# Patient Record
Sex: Male | Born: 1935 | Race: White | Hispanic: No | Marital: Married | Smoking: Former smoker
Health system: Southern US, Community
[De-identification: ages and names within clinical notes are randomized; demographics above are authoritative.]

## PROBLEM LIST (undated history)

## (undated) DIAGNOSIS — C801 Malignant (primary) neoplasm, unspecified: Secondary | ICD-10-CM

## (undated) DIAGNOSIS — N400 Enlarged prostate without lower urinary tract symptoms: Secondary | ICD-10-CM

## (undated) DIAGNOSIS — F0390 Unspecified dementia without behavioral disturbance: Secondary | ICD-10-CM

## (undated) DIAGNOSIS — E785 Hyperlipidemia, unspecified: Secondary | ICD-10-CM

## (undated) DIAGNOSIS — Z86010 Personal history of colonic polyps: Secondary | ICD-10-CM

## (undated) DIAGNOSIS — K5790 Diverticulosis of intestine, part unspecified, without perforation or abscess without bleeding: Secondary | ICD-10-CM

## (undated) HISTORY — PX: NOSE SURGERY: SHX723

## (undated) HISTORY — DX: Hyperlipidemia, unspecified: E78.5

## (undated) HISTORY — DX: Malignant (primary) neoplasm, unspecified: C80.1

## (undated) HISTORY — DX: Benign prostatic hyperplasia without lower urinary tract symptoms: N40.0

## (undated) HISTORY — DX: Personal history of colonic polyps: Z86.010

## (undated) HISTORY — DX: Diverticulosis of intestine, part unspecified, without perforation or abscess without bleeding: K57.90

---

## 1976-06-19 HISTORY — PX: PARTIAL GASTRECTOMY: SHX2172

## 1998-06-19 HISTORY — PX: COLON SURGERY: SHX602

## 1999-04-13 ENCOUNTER — Encounter (INDEPENDENT_AMBULATORY_CARE_PROVIDER_SITE_OTHER): Payer: Self-pay | Admitting: Specialist

## 1999-04-13 ENCOUNTER — Other Ambulatory Visit: Admission: RE | Admit: 1999-04-13 | Discharge: 1999-04-13 | Payer: Self-pay | Admitting: Gastroenterology

## 1999-04-22 ENCOUNTER — Encounter: Payer: Self-pay | Admitting: General Surgery

## 1999-04-26 ENCOUNTER — Inpatient Hospital Stay (HOSPITAL_COMMUNITY): Admission: RE | Admit: 1999-04-26 | Discharge: 1999-05-04 | Payer: Self-pay | Admitting: General Surgery

## 1999-04-26 ENCOUNTER — Encounter (INDEPENDENT_AMBULATORY_CARE_PROVIDER_SITE_OTHER): Payer: Self-pay | Admitting: Specialist

## 2000-01-05 ENCOUNTER — Encounter (INDEPENDENT_AMBULATORY_CARE_PROVIDER_SITE_OTHER): Payer: Self-pay

## 2000-01-05 ENCOUNTER — Other Ambulatory Visit: Admission: RE | Admit: 2000-01-05 | Discharge: 2000-01-05 | Payer: Self-pay | Admitting: Gastroenterology

## 2000-06-19 HISTORY — PX: PROSTATE BIOPSY: SHX241

## 2002-06-19 HISTORY — PX: COLONOSCOPY: SHX174

## 2004-06-06 ENCOUNTER — Ambulatory Visit: Payer: Self-pay | Admitting: Internal Medicine

## 2005-06-19 DIAGNOSIS — Z860100 Personal history of colon polyps, unspecified: Secondary | ICD-10-CM

## 2005-06-19 HISTORY — PX: COLONOSCOPY W/ POLYPECTOMY: SHX1380

## 2005-06-19 HISTORY — DX: Personal history of colon polyps, unspecified: Z86.0100

## 2005-06-19 HISTORY — DX: Personal history of colonic polyps: Z86.010

## 2005-09-14 ENCOUNTER — Ambulatory Visit: Payer: Self-pay | Admitting: Internal Medicine

## 2005-10-12 ENCOUNTER — Ambulatory Visit: Payer: Self-pay | Admitting: Internal Medicine

## 2005-11-06 ENCOUNTER — Ambulatory Visit: Payer: Self-pay | Admitting: Internal Medicine

## 2006-01-04 ENCOUNTER — Ambulatory Visit: Payer: Self-pay | Admitting: Gastroenterology

## 2006-01-15 ENCOUNTER — Encounter (INDEPENDENT_AMBULATORY_CARE_PROVIDER_SITE_OTHER): Payer: Self-pay | Admitting: *Deleted

## 2006-01-15 ENCOUNTER — Ambulatory Visit: Payer: Self-pay | Admitting: Gastroenterology

## 2006-02-14 ENCOUNTER — Encounter: Payer: Self-pay | Admitting: Orthopedic Surgery

## 2006-02-26 ENCOUNTER — Encounter: Payer: Self-pay | Admitting: Orthopedic Surgery

## 2006-04-12 ENCOUNTER — Ambulatory Visit: Payer: Self-pay | Admitting: Internal Medicine

## 2006-04-12 LAB — CONVERTED CEMR LAB
ALT: 21 units/L (ref 0–40)
AST: 22 units/L (ref 0–37)
Chol/HDL Ratio, serum: 4.5
Cholesterol: 169 mg/dL (ref 0–200)
HDL: 37.6 mg/dL — ABNORMAL LOW (ref >39.0)
Hgb A1c MFr Bld: 5.5 % (ref 4.6–6.0)
LDL Cholesterol: 114 mg/dL — ABNORMAL HIGH (ref 0–99)
Triglyceride fasting, serum: 89 mg/dL (ref 0–149)
VLDL: 18 mg/dL (ref 0–40)

## 2006-06-21 ENCOUNTER — Ambulatory Visit: Payer: Self-pay | Admitting: Internal Medicine

## 2006-08-14 ENCOUNTER — Ambulatory Visit: Payer: Self-pay | Admitting: Internal Medicine

## 2006-10-08 DIAGNOSIS — Z860100 Personal history of colon polyps, unspecified: Secondary | ICD-10-CM | POA: Insufficient documentation

## 2006-10-08 DIAGNOSIS — Z85038 Personal history of other malignant neoplasm of large intestine: Secondary | ICD-10-CM | POA: Insufficient documentation

## 2006-12-05 ENCOUNTER — Ambulatory Visit: Payer: Self-pay | Admitting: Internal Medicine

## 2006-12-05 DIAGNOSIS — R7989 Other specified abnormal findings of blood chemistry: Secondary | ICD-10-CM | POA: Insufficient documentation

## 2006-12-05 DIAGNOSIS — E785 Hyperlipidemia, unspecified: Secondary | ICD-10-CM

## 2007-01-14 ENCOUNTER — Ambulatory Visit: Payer: Self-pay | Admitting: Gastroenterology

## 2007-01-30 ENCOUNTER — Ambulatory Visit: Payer: Self-pay | Admitting: Gastroenterology

## 2007-01-30 ENCOUNTER — Encounter: Payer: Self-pay | Admitting: Internal Medicine

## 2007-01-30 ENCOUNTER — Encounter: Payer: Self-pay | Admitting: Gastroenterology

## 2007-04-12 ENCOUNTER — Ambulatory Visit: Payer: Self-pay | Admitting: Internal Medicine

## 2007-04-23 ENCOUNTER — Ambulatory Visit: Payer: Self-pay | Admitting: Internal Medicine

## 2007-12-11 ENCOUNTER — Telehealth: Payer: Self-pay | Admitting: Gastroenterology

## 2008-03-11 ENCOUNTER — Ambulatory Visit: Payer: Self-pay | Admitting: Internal Medicine

## 2008-03-11 DIAGNOSIS — K5730 Diverticulosis of large intestine without perforation or abscess without bleeding: Secondary | ICD-10-CM | POA: Insufficient documentation

## 2008-03-11 DIAGNOSIS — L851 Acquired keratosis [keratoderma] palmaris et plantaris: Secondary | ICD-10-CM | POA: Insufficient documentation

## 2008-03-11 DIAGNOSIS — D400 Neoplasm of uncertain behavior of prostate: Secondary | ICD-10-CM

## 2008-03-12 ENCOUNTER — Encounter (INDEPENDENT_AMBULATORY_CARE_PROVIDER_SITE_OTHER): Payer: Self-pay | Admitting: *Deleted

## 2008-03-16 ENCOUNTER — Encounter (INDEPENDENT_AMBULATORY_CARE_PROVIDER_SITE_OTHER): Payer: Self-pay | Admitting: *Deleted

## 2008-03-24 ENCOUNTER — Encounter: Payer: Self-pay | Admitting: Internal Medicine

## 2008-03-31 ENCOUNTER — Ambulatory Visit: Payer: Self-pay | Admitting: Internal Medicine

## 2008-04-13 ENCOUNTER — Telehealth (INDEPENDENT_AMBULATORY_CARE_PROVIDER_SITE_OTHER): Payer: Self-pay | Admitting: *Deleted

## 2008-05-20 ENCOUNTER — Ambulatory Visit: Payer: Self-pay | Admitting: Internal Medicine

## 2008-05-20 DIAGNOSIS — N400 Enlarged prostate without lower urinary tract symptoms: Secondary | ICD-10-CM | POA: Insufficient documentation

## 2008-05-21 ENCOUNTER — Ambulatory Visit: Payer: Self-pay | Admitting: Internal Medicine

## 2008-05-22 ENCOUNTER — Encounter (INDEPENDENT_AMBULATORY_CARE_PROVIDER_SITE_OTHER): Payer: Self-pay | Admitting: *Deleted

## 2008-05-30 LAB — CONVERTED CEMR LAB
ALT: 24 units/L (ref 0–53)
AST: 29 units/L (ref 0–37)
Albumin: 3.9 g/dL (ref 3.5–5.2)
Alkaline Phosphatase: 43 units/L (ref 39–117)
Basophils Absolute: 0 10*3/uL (ref 0.0–0.1)
Basophils Relative: 0.6 % (ref 0.0–3.0)
Bilirubin, Direct: 0.1 mg/dL (ref 0.0–0.3)
Eosinophils Absolute: 0.5 10*3/uL (ref 0.0–0.7)
Eosinophils Relative: 8.4 % — ABNORMAL HIGH (ref 0.0–5.0)
Free T4: 1 ng/dL (ref 0.6–1.6)
HCT: 42.2 % (ref 39.0–52.0)
Hemoglobin: 14.3 g/dL (ref 13.0–17.0)
Lymphocytes Relative: 23.8 % (ref 12.0–46.0)
MCHC: 34 g/dL (ref 30.0–36.0)
MCV: 87.9 fL (ref 78.0–100.0)
Monocytes Absolute: 0.3 10*3/uL (ref 0.1–1.0)
Monocytes Relative: 5.4 % (ref 3.0–12.0)
Neutro Abs: 3.8 10*3/uL (ref 1.4–7.7)
Neutrophils Relative %: 61.8 % (ref 43.0–77.0)
Platelets: 193 10*3/uL (ref 150–400)
RBC: 4.8 M/uL (ref 4.22–5.81)
RDW: 12.4 % (ref 11.5–14.6)
TSH: 1.6 microintl units/mL (ref 0.35–5.50)
Total Bilirubin: 0.6 mg/dL (ref 0.3–1.2)
Total Protein: 6.6 g/dL (ref 6.0–8.3)
WBC: 6 10*3/uL (ref 4.5–10.5)

## 2008-06-01 ENCOUNTER — Encounter (INDEPENDENT_AMBULATORY_CARE_PROVIDER_SITE_OTHER): Payer: Self-pay | Admitting: *Deleted

## 2008-06-03 ENCOUNTER — Ambulatory Visit: Payer: Self-pay | Admitting: Internal Medicine

## 2008-06-03 LAB — CONVERTED CEMR LAB
OCCULT 1: NEGATIVE
OCCULT 2: NEGATIVE
OCCULT 3: NEGATIVE

## 2008-06-04 ENCOUNTER — Encounter (INDEPENDENT_AMBULATORY_CARE_PROVIDER_SITE_OTHER): Payer: Self-pay | Admitting: *Deleted

## 2008-06-10 ENCOUNTER — Ambulatory Visit: Payer: Self-pay | Admitting: Internal Medicine

## 2008-08-03 ENCOUNTER — Ambulatory Visit: Payer: Self-pay | Admitting: Internal Medicine

## 2008-09-14 ENCOUNTER — Telehealth (INDEPENDENT_AMBULATORY_CARE_PROVIDER_SITE_OTHER): Payer: Self-pay | Admitting: *Deleted

## 2009-03-12 ENCOUNTER — Ambulatory Visit: Payer: Self-pay | Admitting: Internal Medicine

## 2009-03-12 DIAGNOSIS — Z85828 Personal history of other malignant neoplasm of skin: Secondary | ICD-10-CM

## 2009-03-15 ENCOUNTER — Encounter (INDEPENDENT_AMBULATORY_CARE_PROVIDER_SITE_OTHER): Payer: Self-pay | Admitting: *Deleted

## 2009-03-16 ENCOUNTER — Encounter (INDEPENDENT_AMBULATORY_CARE_PROVIDER_SITE_OTHER): Payer: Self-pay | Admitting: *Deleted

## 2009-03-18 ENCOUNTER — Ambulatory Visit: Payer: Self-pay | Admitting: Internal Medicine

## 2009-03-18 DIAGNOSIS — R972 Elevated prostate specific antigen [PSA]: Secondary | ICD-10-CM

## 2009-03-18 DIAGNOSIS — E162 Hypoglycemia, unspecified: Secondary | ICD-10-CM

## 2009-03-30 ENCOUNTER — Encounter: Payer: Self-pay | Admitting: Internal Medicine

## 2009-05-31 ENCOUNTER — Encounter: Payer: Self-pay | Admitting: Internal Medicine

## 2009-07-05 ENCOUNTER — Encounter: Payer: Self-pay | Admitting: Internal Medicine

## 2009-08-11 ENCOUNTER — Ambulatory Visit: Payer: Self-pay | Admitting: Internal Medicine

## 2009-08-11 DIAGNOSIS — R413 Other amnesia: Secondary | ICD-10-CM

## 2009-08-11 LAB — CONVERTED CEMR LAB

## 2009-08-23 LAB — CONVERTED CEMR LAB
Folate: >20 ng/mL
TSH: 1.85 microintl units/mL (ref 0.35–5.50)

## 2009-11-22 ENCOUNTER — Ambulatory Visit: Payer: Self-pay | Admitting: Internal Medicine

## 2009-12-14 ENCOUNTER — Encounter (INDEPENDENT_AMBULATORY_CARE_PROVIDER_SITE_OTHER): Payer: Self-pay | Admitting: *Deleted

## 2010-01-06 ENCOUNTER — Telehealth (INDEPENDENT_AMBULATORY_CARE_PROVIDER_SITE_OTHER): Payer: Self-pay | Admitting: *Deleted

## 2010-01-07 ENCOUNTER — Encounter (INDEPENDENT_AMBULATORY_CARE_PROVIDER_SITE_OTHER): Payer: Self-pay

## 2010-01-11 ENCOUNTER — Ambulatory Visit: Payer: Self-pay | Admitting: Gastroenterology

## 2010-01-14 ENCOUNTER — Encounter: Payer: Self-pay | Admitting: Internal Medicine

## 2010-01-17 ENCOUNTER — Telehealth: Payer: Self-pay | Admitting: Gastroenterology

## 2010-01-25 ENCOUNTER — Ambulatory Visit: Payer: Self-pay | Admitting: Gastroenterology

## 2010-01-28 ENCOUNTER — Encounter: Payer: Self-pay | Admitting: Gastroenterology

## 2010-02-16 ENCOUNTER — Ambulatory Visit: Payer: Self-pay | Admitting: Internal Medicine

## 2010-02-16 LAB — CONVERTED CEMR LAB: Rapid Strep: NEGATIVE

## 2010-02-17 ENCOUNTER — Telehealth: Payer: Self-pay | Admitting: Internal Medicine

## 2010-03-10 ENCOUNTER — Ambulatory Visit: Payer: Self-pay | Admitting: Internal Medicine

## 2010-04-13 ENCOUNTER — Ambulatory Visit: Payer: Self-pay | Admitting: Internal Medicine

## 2010-04-13 ENCOUNTER — Encounter: Payer: Self-pay | Admitting: Internal Medicine

## 2010-04-13 DIAGNOSIS — K219 Gastro-esophageal reflux disease without esophagitis: Secondary | ICD-10-CM

## 2010-04-18 LAB — CONVERTED CEMR LAB
ALT: 29 units/L (ref 0–53)
Albumin: 4.6 g/dL (ref 3.5–5.2)
BUN: 11 mg/dL (ref 6–23)
Basophils Absolute: 0.1 10*3/uL (ref 0.0–0.1)
CO2: 29 meq/L (ref 19–32)
Chloride: 100 meq/L (ref 96–112)
Cholesterol: 197 mg/dL (ref 0–200)
Glucose, Bld: 84 mg/dL (ref 70–99)
HCT: 42.8 % (ref 39.0–52.0)
Lymphs Abs: 1.6 10*3/uL (ref 0.7–4.0)
MCV: 88.1 fL (ref 78.0–100.0)
Monocytes Absolute: 0.4 10*3/uL (ref 0.1–1.0)
Neutro Abs: 3.9 10*3/uL (ref 1.4–7.7)
Platelets: 210 10*3/uL (ref 150.0–400.0)
Potassium: 4.4 meq/L (ref 3.5–5.1)
RDW: 13.3 % (ref 11.5–14.6)
TSH: 1.55 microintl units/mL (ref 0.35–5.50)
Total Bilirubin: 0.6 mg/dL (ref 0.3–1.2)
VLDL: 37.4 mg/dL (ref 0.0–40.0)

## 2010-04-28 ENCOUNTER — Encounter: Payer: Self-pay | Admitting: Internal Medicine

## 2010-07-17 LAB — CONVERTED CEMR LAB
ALT: 22 units/L (ref 0–53)
ALT: 25 units/L (ref 0–40)
ALT: 30 units/L (ref 0–53)
AST: 24 units/L (ref 0–37)
AST: 29 units/L (ref 0–37)
AST: 39 units/L — ABNORMAL HIGH (ref 0–37)
Albumin: 4.3 g/dL (ref 3.5–5.2)
Alkaline Phosphatase: 57 units/L (ref 39–117)
BUN: 11 mg/dL (ref 6–23)
BUN: 13 mg/dL (ref 6–23)
BUN: 14 mg/dL (ref 6–23)
Basophils Absolute: 0 10*3/uL (ref 0.0–0.1)
Basophils Relative: 0.7 % (ref 0.0–3.0)
Bilirubin, Direct: 0.1 mg/dL (ref 0.0–0.3)
CO2: 32 meq/L (ref 19–32)
Calcium: 9.3 mg/dL (ref 8.4–10.5)
Chloride: 110 meq/L (ref 96–112)
Cholesterol, target level: 200 mg/dL
Cholesterol: 158 mg/dL (ref 0–200)
Cholesterol: 162 mg/dL (ref 0–200)
Creatinine, Ser: 0.8 mg/dL (ref 0.4–1.5)
Creatinine, Ser: 0.9 mg/dL (ref 0.4–1.5)
Eosinophils Absolute: 0.4 10*3/uL (ref 0.0–0.7)
Eosinophils Relative: 8.5 % — ABNORMAL HIGH (ref 0.0–5.0)
Eosinophils Relative: 8.7 % — ABNORMAL HIGH (ref 0.0–5.0)
GFR calc Af Amer: 123 mL/min
GFR calc non Af Amer: 101 mL/min
GFR calc non Af Amer: 78.05 mL/min (ref >60)
Glucose, Bld: 98 mg/dL (ref 70–99)
HCT: 41.9 % (ref 39.0–52.0)
HCT: 42.7 % (ref 39.0–52.0)
HDL goal, serum: 40 mg/dL
HDL: 36.1 mg/dL — ABNORMAL LOW (ref >39.0)
HDL: 43.6 mg/dL (ref >39.0)
Hemoglobin: 14.1 g/dL (ref 13.0–17.0)
Hemoglobin: 14.5 g/dL (ref 13.0–17.0)
Hgb A1c MFr Bld: 5.7 % (ref 4.6–6.0)
Hgb A1c MFr Bld: 5.8 % (ref 4.6–6.0)
LDL Cholesterol: 102 mg/dL — ABNORMAL HIGH (ref 0–99)
LDL Cholesterol: 91 mg/dL (ref 0–99)
LDL Cholesterol: 92 mg/dL (ref 0–99)
LDL Goal: 130 mg/dL
Lymphocytes Relative: 28.2 % (ref 12.0–46.0)
Lymphs Abs: 1.3 10*3/uL (ref 0.7–4.0)
MCHC: 33.6 g/dL (ref 30.0–36.0)
MCV: 87.3 fL (ref 78.0–100.0)
Monocytes Absolute: 0.3 10*3/uL (ref 0.1–1.0)
Monocytes Relative: 6.7 % (ref 3.0–12.0)
Monocytes Relative: 6.7 % (ref 3.0–12.0)
Neutro Abs: 2.7 10*3/uL (ref 1.4–7.7)
Neutrophils Relative %: 55.9 % (ref 43.0–77.0)
PSA: 3.32 ng/mL (ref 0.10–4.00)
Platelets: 201 10*3/uL (ref 150–400)
Platelets: 215 10*3/uL (ref 150.0–400.0)
Potassium: 4.2 meq/L (ref 3.5–5.1)
Potassium: 4.3 meq/L (ref 3.5–5.1)
Potassium: 4.5 meq/L (ref 3.5–5.1)
RBC: 4.81 M/uL (ref 4.22–5.81)
RDW: 12.1 % (ref 11.5–14.6)
Sodium: 141 meq/L (ref 135–145)
Sodium: 144 meq/L (ref 135–145)
TSH: 1.6 microintl units/mL (ref 0.35–5.50)
TSH: 1.81 microintl units/mL (ref 0.35–5.50)
Total Bilirubin: 0.9 mg/dL (ref 0.3–1.2)
Total Bilirubin: 1.1 mg/dL (ref 0.3–1.2)
Total CHOL/HDL Ratio: 3.6
Total CHOL/HDL Ratio: 4
Total CHOL/HDL Ratio: 4.5
Total Protein: 6.9 g/dL (ref 6.0–8.3)
Triglycerides: 114 mg/dL (ref 0–149)
Triglycerides: 120 mg/dL (ref 0–149)
VLDL: 17.8 mg/dL (ref 0.0–40.0)
VLDL: 23 mg/dL (ref 0–40)
VLDL: 24 mg/dL (ref 0–40)
WBC: 4.8 10*3/uL (ref 4.5–10.5)
WBC: 4.8 10*3/uL (ref 4.5–10.5)

## 2010-07-19 NOTE — Letter (Signed)
Summary: Patient Notice- Polyp Results  [facility]  [address] [place], [place] [address]   Phone: [phone]  Fax: [phone]        January 28, 2010 MRN: [id number]    [facility] [address] [place], [place]  [address]    Dear Mr. Sherfield,  I am pleased to inform you that the colon polyp(s) removed during your recent colonoscopy was (were) found to be benign (no cancer detected) upon pathologic examination.  I recommend you have a repeat colonoscopy examination in 3_ years to look for recurrent polyps, as having colon polyps increases your risk for having recurrent polyps or even colon cancer in the future.  Should you develop new or worsening symptoms of abdominal pain, bowel habit changes or bleeding from the rectum or bowels, please schedule an evaluation with either your primary care physician or with me.  Additional information/recommendations:  _X_ No further action with gastroenterology is needed at this time. Please      follow-up with your primary care physician for your other healthcare      needs.  __ Please call [phone] to schedule a return visit to review your      situation.  __ Please keep your follow-up visit as already scheduled.  __ Continue treatment plan as outlined the day of your exam.  Please call us if you are having persistent problems or have questions about your condition that have not been fully answered at this time.  Sincerely,  Mardella Layman MD [facility]  This letter has been electronically signed by your physician.  Appended Document: Patient Notice- Polyp Results letter mailed

## 2010-07-19 NOTE — Letter (Signed)
Summary: [facility]  [facility]   Imported By: Lanelle Bal 07/15/2009 16:11:18  _____________________________________________________________________  External Attachment:    Type:   Image     Comment:   External Document

## 2010-07-19 NOTE — Miscellaneous (Signed)
Summary: Lec previsit  Clinical Lists Changes  Medications: Added new medication of MOVIPREP 100 GM  SOLR (PEG-KCL-NACL-NASULF-NA ASC-C) As per prep instructions. - Signed Rx of MOVIPREP 100 GM  SOLR (PEG-KCL-NACL-NASULF-NA ASC-C) As per prep instructions.;  #1 x 0;  Signed;  Entered by: Ulis Rias RN;  Authorized by: Mardella Layman MD [facility];  Method used: Electronically [facility]  [address] [id number] [address], [place], [place]  [address], Ph: [phone] or [phone], Fax: [phone] Observations: Added new observation of NKA: T (01/11/2010 8:50)    Prescriptions: MOVIPREP 100 GM  SOLR (PEG-KCL-NACL-NASULF-NA ASC-C) As per prep instructions.  #1 x 0   Entered by:   Ulis Rias RN   Authorized by:   Mardella Layman MD [facility]   Signed by:   Ulis Rias RN on 01/11/2010   Method used:   [facility] to        [facility]  [address] [id number]       [address]       [place], [place]  [address]       Ph: [phone] or [phone]       Fax: [phone]   RxID:   [id number]

## 2010-07-19 NOTE — Assessment & Plan Note (Signed)
Summary: not feeling well//lch   Vital Signs:  Patient profile:   76 year old male Weight:      194 pounds Temp:     97.3 degrees F oral Pulse rate:   56 / minute BP sitting:   140 / 78  (right arm)  Vitals Entered By: Jeremy Johann CMA (August 11, 2009 12:22 PM) CC: trouble remember xmonth Comments REVIEWED MED LIST, PATIENT AGREED DOSE AND INSTRUCTION CORRECT    CC:  trouble remember xmonth.  History of Present Illness: Memory loss ? 6 months, mainly not staying task. He has no issues with ADL or driving.His mother had a brain tumor with seizures. No dementia in FH. PMH of increased alcohol in [employer].  Allergies (verified): No Known Drug Allergies  Review of Systems General:  Denies chills, fever, sweats, and weight loss. Eyes:  Denies blurring, double vision, and vision loss-both eyes. CV:  Denies chest pain or discomfort and palpitations. GU:  Denies incontinence. Derm:  Denies lesion(s) and rash. Neuro:  Denies brief paralysis, difficulty with concentration, disturbances in coordination, falling down, headaches, inability to speak, numbness, poor balance, tingling, tremors, visual disturbances, and weakness. Psych:  Complains of irritability; denies anxiety, depression, easily angered, and easily tearful.  Physical Exam  General:  well-nourished,in no acute distress; alert,appropriate and cooperative throughout examination Eyes:  No corneal or conjunctival inflammation noted. EOMI. Perrla. Field of  Vision grossly normal. No lid lag Mouth:  Oral mucosa and oropharynx without lesions or exudates.  Tongue w/o deviation Heart:  Normal rate and regular rhythm. S1 and S2 normal without gallop, murmur, click, rub. S4 Pulses:  R and L carotid  pulses are full and equal bilaterally w/o bruits Extremities:  No clubbing, cyanosis, edema. Neurologic:  alert & oriented X3, cranial nerves II-XII intact, strength normal in all extremities, sensation intact to light touch, gait  normal, DTRs symmetrical and normal, finger-to-nose normal, and Romberg negative.  Minimal R hand tremor with finger to nose Skin:  Solar changes Cervical Nodes:  No lymphadenopathy noted Axillary Nodes:  No palpable lymphadenopathy Psych:  normally interactive, good eye contact, not anxious appearing, and not depressed appearing.26 on 30 scale on MMSE   Impression & Recommendations:  Problem # 1:  MEMORY LOSS (ICD-780.93) Score 26 of 30 MMSE Orders: Venipuncture (09811) TLB-B12 + Folate Pnl (91478_29562-Z30/QMV) TLB-TSH (Thyroid Stimulating Hormone) (84443-TSH) T-RPR (Syphilis) (78469-62952)  Complete Medication List: 1)  Daily Multiple Vitamins Tabs (Multiple vitamin) 2)  Vitamin E 400 Unit Caps (Vitamin e) .... Once daily 3)  Saw Palmetto Caps (Saw palmetto (serenoa repens) caps) 4)  Basa  .... 2 tab once daily 5)  Pravastatin Sodium 40 Mg Tabs (Pravastatin sodium) .... 1/2 tab qhs 6)  Fish Oil Oil (Fish oil) 7)  [place]ag  8)  Calcium 600/vitamin D 600-400 Mg-unit Tabs (Calcium carbonate-vitamin d) .Marland Kitchen.. 1 by mouth once daily 9)  Zantac 150 Mg Tabs (Ranitidine hcl) .Marland Kitchen.. 1 by mouth once daily as needed 10)  Coq10 100 Mg Caps (Coenzyme q10) .Marland Kitchen.. 1 by mouth once daily 11)  Tylenol 325 Mg Tabs (Acetaminophen) .... As needed 12)  L-methylfolate Calcium 7.5 Mg Tabs (L-methylfolate) .Marland Kitchen.. 1 once daily  Patient Instructions: 1)  Mental exercises as discussed. Trial of Deplin once daily  Prescriptions: L-METHYLFOLATE CALCIUM 7.5 MG TABS (L-METHYLFOLATE) 1 once daily  #30 x 5   Entered and Authorized by:   Marga Melnick MD   Signed by:   Marga Melnick MD on 08/11/2009   Method used:  Print then Give to Patient   RxID:   [id number]

## 2010-07-19 NOTE — Assessment & Plan Note (Signed)
Summary: 3 month roa//lch   Vital Signs:  Patient profile:   76 year old male Weight:      189.6 pounds Pulse rate:   72 / minute Resp:     16 per minute BP sitting:   120 / 76  (left arm) Cuff size:   large  Vitals Entered By: Shonna Chock (November 22, 2009 10:11 AM) CC: 3 Month follow-up, discuss labs (copy given) Comments REVIEWED MED LIST, PATIENT AGREED DOSE AND INSTRUCTION CORRECT    CC:  3 Month follow-up and discuss labs (copy given).  History of Present Illness: The memory loss has resolved; he feels symptoms were rel;ated to his wife's health issues & pressure it placed on him. He  has started doing crossword puzzles ; he denies depression.Labs reviewed ; all were normal.  Allergies (verified): No Known Drug Allergies  Review of Systems GU:  Denies incontinence. Neuro:  Denies brief paralysis, disturbances in coordination, falling down, numbness, poor balance, tingling, and weakness. Psych:  Denies anxiety, depression, easily angered, easily tearful, and irritability.  Physical Exam  General:  well-nourished,in no acute distress; alert,appropriate and cooperative throughout examination Heart:  Normal rate and regular rhythm. S1 and S2 normal without gallop, murmur, click, rub . S4 Pulses:  R and L carotid pulses are full and equal bilaterally w/o bruits Neurologic:  alert & oriented X3, strength normal in all extremities, gait normal, and DTRs symmetrical and normal.  MMSE : 27; he missed the 3 recall items Psych:  memory intact for recent and remote, normally interactive, good eye contact, not anxious appearing, and not depressed appearing.     Impression & Recommendations:  Problem # 1:  MEMORY LOSS (ICD-780.93) not documentd on repeat MMSE; major life  stressors   Complete Medication List: 1)  Daily Multiple Vitamins Tabs (Multiple vitamin) 2)  Vitamin E 400 Unit Caps (Vitamin e) .... Once daily 3)  Saw Palmetto Caps (Saw palmetto (serenoa repens) caps) 4)   Basa  .... 2 tab once daily 5)  Pravastatin Sodium 40 Mg Tabs (Pravastatin sodium) .... 1/2 tab qhs 6)  Fish Oil Oil (Fish oil) 7)  [place]  8)  Calcium 600/vitamin D 600-400 Mg-unit Tabs (Calcium carbonate-vitamin d) .Marland Kitchen.. 1 by mouth once daily 9)  Zantac 150 Mg Tabs (Ranitidine hcl) .Marland Kitchen.. 1 by mouth once daily as needed 10)  Coq10 100 Mg Caps (Coenzyme q10) .Marland Kitchen.. 1 by mouth once daily 11)  Tylenol 325 Mg Tabs (Acetaminophen) .... As needed 12)  L-methylfolate Calcium 7.5 Mg Tabs (L-methylfolate) .Marland Kitchen.. 1 once daily  Patient Instructions: 1)  Continue  "mind stimulating  exercises" as we discussed. Call if stress is excessive.

## 2010-07-19 NOTE — Assessment & Plan Note (Signed)
Summary: CPX/FASTING/KN   Vital Signs:  Patient profile:   76 year old male Height:      68.5 inches Weight:      193.8 pounds BMI:     29.14 Temp:     97.7 degrees F oral Pulse rate:   64 / minute Resp:     14 per minute BP sitting:   118 / 70  (left arm) Cuff size:   large  Vitals Entered By: Shonna Chock CMA (April 13, 2010 11:02 AM)  CC:  Heartburn.  History of Present Illness: Here for Medicare AWV: 1.Risk factors based on Past M, S, F history:GERD; Dyslipidemia; 2.Physical Activities: walking daily 1 mile  3.Depression/mood:he is under stress due his wife's illnesses 4.Hearing: aids for both ears; only L worn(evaluated annually) 5.ADL's:  no limitations 6.Fall Risk: denied ; no balance issues 7.Home Safety: safety proofed due to wife's condition (end stage DJD) 8.Height, weight, &visual acuity:wall chart read @ 6 ft with lenses 9.Counseling:none requested ; POA & Living Will  not in place  10.Labs ordered based on risk factors: see Orders 11. Referral Coordination: none requested 12. Care Plan: see Instructions 13.Cognitive Assessment: Oriented X 3; memory & recall intact  on second try  ; "WORLD" spelled backwards ; mood & affect  normal.      This is a 76 year old man who  has PMH of GERD.  The patient denies acid reflux, sour taste in mouth, epigastric pain, chest pain, trouble swallowing, weight loss, and weight gain.  The patient denies the following alarm features: melena, dysphagia, hematemesis, and vomiting.  Prior evaluation has included EGD.  The patient has found the following treatments to be effective in the past : an H2 blocker.  He is not taking this @ present excepet  as needed . Hyperlipidemia Follow-Up      The patient also presents for Hyperlipidemia follow-up.  The patient denies muscle aches, GI upset, flushing, itching, constipation, diarrhea, and fatigue.  The patient denies the following symptoms: exercise intolerance, dypsnea, palpitations,  syncope, and pedal edema.  Compliance with medications (by patient report) has been near 100%.  Dietary compliance has been good.  Adjunctive measures currently used by the patient include ASA and fish oil supplements.    Lipid Management History:      Positive NCEP/ATP III risk factors include male age 50 years old or older, HDL cholesterol less than 40, and family history for ischemic heart disease (males less than 46 years old).  Negative NCEP/ATP III risk factors include non-diabetic, non-tobacco-user status, non-hypertensive, no ASHD (atherosclerotic heart disease), no prior stroke/TIA, no peripheral vascular disease, and no history of aortic aneurysm.     Preventive Screening-Counseling & Management  Alcohol-Tobacco     Alcohol drinks/day: 0     Smoking Status: quit > 6 months     Year Quit: 1982  Caffeine-Diet-Exercise     Caffeine use/day: rarely , only decaf  Hep-HIV-STD-Contraception     Dental Visit-last 6 months yes     Sun Exposure-Excessive: no  Safety-Violence-Falls     Seat Belt Use: yes      Blood Transfusions:  prior to 1987 and Post DUD with bleed.        Travel History:  last foreign travel  2004([place]).    Current Medications (verified): 1)  Daily Multiple Vitamins   Tabs (Multiple Vitamin) 2)  Vitamin E 400 Unit  Caps (Vitamin E) .... Once Daily 3)  Saw Palmetto   Caps [employer] (  Serenoa Repens) Caps) 4)  Basa .... 2 Tab Once Daily 5)  Pravastatin Sodium 40 Mg  Tabs (Pravastatin Sodium) .... 1/2 Tab Qhs 6)  Fish Oil   Oil (Fish Oil) 7)  [place]ag 8)  Calcium 600/vitamin D 600-400 Mg-Unit Tabs (Calcium Carbonate-Vitamin D) .Marland Kitchen.. 1 By Mouth Once Daily 9)  Zantac 150 Mg Tabs (Ranitidine Hcl) .Marland Kitchen.. 1 By Mouth Once Daily As Needed 10)  Coq10 100 Mg Caps (Coenzyme Q10) .Marland Kitchen.. 1 By Mouth Once Daily 11)  Tylenol 325 Mg Tabs (Acetaminophen) .... As Needed 12)  L-Methylfolate Calcium 7.5 Mg Tabs (L-Methylfolate) .Marland Kitchen.. 1 Once Daily  Allergies (verified): No Known  Drug Allergies  Past History:  Past Medical History: Colon cancer, PMH  of in 2000 Colonic polyps,PMH  of in 2007, Dr Jarold Motto Diverticulosis, colon Benign prostatic hypertrophy, Dr Aldean Ast Skin cancer, PMH  of Hyperlipidemia: Framingham Study LDL goal = < 130  Past Surgical History: Colon Cancer surgery 2000 Ulcers-bleeding, partial gastrectomy, ? 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 primary Mother:cancer  ? cns  Siblings: bro: MI < 55, d @83  with MI,pacer ; sister :cancer  ovary; bro: cancer  lung  Social History: Former Smoker: quit 1982 Alcohol use-no Retired Married Regular exercise-yes Smoking Status:  quit > 6 months Caffeine use/day:  rarely , only decaf Dental Care w/in 6 mos.:  yes Sun Exposure-Excessive:  no Seat Belt Use:  yes Blood Transfusions:  prior to 1987, Post DUD with bleed  Review of Systems       The patient complains of suspicious skin lesions.  The patient denies anorexia, fever, hoarseness, prolonged cough, hemoptysis, hematuria, unusual weight change, abnormal bleeding, enlarged lymph nodes, and angioedema.    Physical Exam  General:  well-nourished; S/P chemical facial treatment with marked erythema;alert,appropriate and cooperative throughout examination Head:  Normocephalic and atraumatic without obvious abnormalities. No apparent alopecia  Eyes:  No corneal or conjunctival inflammation noted.  Perrla. Funduscopic exam benign, without hemorrhages, exudates or papilledema.  Ears:  External ear exam shows no significant lesions or deformities.  Otoscopic examination reveals clear canals, tympanic membranes are intact bilaterally without bulging, retraction, inflammation or discharge. Hearing is grossly normal bilaterally. Nose:   External nasal examination shows no deformity or inflammation. Nasal mucosa are pink and moist without lesions or exudates.Septal deviation with occlusion Mouth:  Oral mucosa and oropharynx without lesions or exudates.  Teeth in good repair; upper plate. Neck:  No deformities, masses, or tenderness noted. Lungs:  Normal respiratory effort, chest expands symmetrically. Lungs are clear to auscultation, no crackles or wheezes. Heart:  Normal rate and regular rhythm. S1 and S2 normal without gallop, murmur, click, rub or other extra sounds. Abdomen:  Bowel sounds positive,abdomen soft and non-tender without masses, organomegaly or hernias noted. Mid line op scar Genitalia:  Dr Aldean Ast  Msk:  No deformity or scoliosis noted of thoracic or lumbar spine but asymmetry of thoracic muscles ( R > L).   Pulses:  R and L carotid,radial,dorsalis pedis and posterior tibial pulses are full and equal bilaterally Extremities:  No clubbing, cyanosis, edema. DIP OA changes RUE Neurologic:  alert & oriented X3 and DTRs symmetrical and normal.   Skin:  Splotchy facial  erythema from chemical treatments Cervical Nodes:  No lymphadenopathy noted Axillary Nodes:  No palpable lymphadenopathy Psych:  memory intact for recent and remote, normally interactive, and  good eye contact.     Impression & Recommendations:  Problem # 1:  PREVENTIVE HEALTH CARE (ICD-V70.0)  Orders: [facility] -Subsequent Annual Wellness Visit [phone])  Problem # 2:  HYPERLIPIDEMIA (ICD-272.4)  His updated medication list for this problem includes:    Pravastatin Sodium 40 Mg Tabs (Pravastatin sodium) .Marland Kitchen... 1/2 tab qhs  Orders: EKG w/ Interpretation (93000) Venipuncture (60454) TLB-Lipid Panel (80061-LIPID) TLB-BMP (Basic Metabolic Panel-BMET) (80048-METABOL) TLB-Hepatic/Liver Function Pnl (80076-HEPATIC) TLB-TSH (Thyroid Stimulating Hormone) (84443-TSH)  Problem # 3:  GERD (ICD-530.81)  controlled; PMH of DUD His updated medication list  for this problem includes:    Zantac 150 Mg Tabs (Ranitidine hcl) .Marland Kitchen... 1 by mouth once daily as needed  Orders: Venipuncture (09811) TLB-CBC Platelet - w/Differential (85025-CBCD)  Problem # 4:  ELEVATED PROSTATE SPECIFIC ANTIGEN (ICD-790.93) as per Dr Aldean Ast  Problem # 5:  COLONIC POLYPS, HX OF (ICD-V12.72) as per Dr Jarold Motto  Complete Medication List: 1)  Daily Multiple Vitamins Tabs (Multiple vitamin) 2)  Vitamin E 400 Unit Caps (Vitamin e) .... Once daily 3)  Saw Palmetto Caps (Saw palmetto (serenoa repens) caps) 4)  Basa  .... 2 tab once daily 5)  Pravastatin Sodium 40 Mg Tabs (Pravastatin sodium) .... 1/2 tab qhs 6)  Fish Oil Oil (Fish oil) 7)  [place]  8)  Calcium 600/vitamin D 600-400 Mg-unit Tabs (Calcium carbonate-vitamin d) .Marland Kitchen.. 1 by mouth once daily 9)  Zantac 150 Mg Tabs (Ranitidine hcl) .Marland Kitchen.. 1 by mouth once daily as needed 10)  Coq10 100 Mg Caps (Coenzyme q10) .Marland Kitchen.. 1 by mouth once daily 11)  Tylenol 325 Mg Tabs (Acetaminophen) .... As needed 12)  L-methylfolate Calcium 7.5 Mg Tabs (L-methylfolate) .Marland Kitchen.. 1 once daily  Lipid Assessment/Plan:      Based on NCEP/ATP III, the patient's risk factor category is "2 or more risk factors and a calculated 10 year CAD risk of < 20%".  The patient's lipid goals are as follows: Total cholesterol goal is 200; LDL cholesterol goal is 130; HDL cholesterol goal is 40; Triglyceride goal is 150.    Patient Instructions: 1)  Consider POA & Living Will completeion. 2)  It is important that you exercise regularly at least 20 minutes 5 times a week. If you develop chest pain, have severe difficulty breathing, or feel very tired , stop exercising immediately and seek medical attention 81 mg COATED  Aspirin every day with a meal. 3)  Avoid foods high in acid (tomatoes, citrus juices, spicy foods). Avoid eating within two hours of lying down or before exercising. Do not over eat; try smaller more frequent meals. Elevate head of bed twelve  inches when sleeping.   Orders Added: 1)  [facility] -Subsequent Annual Wellness Visit [G0439] 2)  Est. Patient Level III [91478] 3)  EKG w/ Interpretation [93000] 4)  Venipuncture [36415] 5)  TLB-Lipid Panel [80061-LIPID] 6)  TLB-BMP (Basic Metabolic Panel-BMET) [80048-METABOL] 7)  TLB-CBC Platelet - w/Differential [85025-CBCD] 8)  TLB-Hepatic/Liver Function Pnl [80076-HEPATIC] 9)  TLB-TSH (Thyroid Stimulating Hormone) [29562-ZHY]

## 2010-07-19 NOTE — Progress Notes (Signed)
Summary: derm recommendation  Phone Note Call from Patient Call back at Home Phone [phone]   Summary of Call: patient has lesions on his face - he goes to [facility]  -- he wants to know who dr hopper would recommend in that group Initial call taken by: Okey Regal [place],  January 06, 2010 4:46 PM  Follow-up for Phone Call        Dr.Hopper please advise Follow-up by: Shonna Chock CMA,  January 06, 2010 4:53 PM  Additional Follow-up for Phone Call Additional follow up Details #1::        [facility] [place]; any physician he sees in that group will be outstanding (he should tell the MD I said this when seen). Hopp Additional Follow-up by: Marga Melnick MD,  January 07, 2010 6:17 AM    Additional Follow-up for Phone Call Additional follow up Details #2::    I tried to called the patient, no answer. Recording stated machine is off, unable to leave a message./[facility] CMA  January 07, 2010 8:07 AM   Left message on machine with Dr.Hopper's response, patient to call if any additional questions or concerns./[facility] CMA  January 07, 2010 1:41 PM

## 2010-07-19 NOTE — Procedures (Signed)
Summary: Colonoscopy  Patient: [name] Note: All result statuses are Final unless otherwise noted.  Tests: (1) Colonoscopy (COL)   COL Colonoscopy           DONE     [facility]     [address]     [place], [place]  [address]           COLONOSCOPY PROCEDURE REPORT           PATIENT:  Manuel Evans, Manuel Evans  MR#:  [id number]     BIRTHDATE:  Dec 19, 1934, 74 yrs. old  GENDER:  male     ENDOSCOPIST:  Vania Rea. Jarold Motto, MD, [facility]     REF. BY:     PROCEDURE DATE:  01/25/2010     PROCEDURE:  Colonoscopy with snare polypectomy     ASA CLASS:  Class II     INDICATIONS:  history of pre-cancerous (adenomatous) colon polyps,     history of colon cancer     MEDICATIONS:   Fentanyl 50 mcg IV, Versed 5 mg IV           DESCRIPTION OF PROCEDURE:   After the risks benefits and     alternatives of the procedure were thoroughly explained, informed     consent was obtained.  Digital rectal exam was performed and     revealed no abnormalities.   The LB [id number] endoscope     was introduced through the anus and advanced to the anastomosis,     without limitations.  The quality of the prep was excellent, using     MoviPrep.  The instrument was then slowly withdrawn as the colon     was fully examined.     <<PROCEDUREIMAGES>>           FINDINGS:  There was a surgical anastomosis. right colon resection     and anastomosis appears normal.  A sessile polyp was found in the     mid transverse colon. see pictures.hot snare removed  Moderate     diverticulosis was found in the sigmoid to descending colon     segments.   Retroflexed views in the rectum revealed no     abnormalities.    The scope was then withdrawn from the patient     and the procedure completed.           COMPLICATIONS:  None     ENDOSCOPIC IMPRESSION:     1) Anastomosis     2) Sessile polyp in the mid transverse colon     3) Moderate diverticulosis in the sigmoid to descending colon     segments     hx  of prior cancer.needs close f/u.     RECOMMENDATIONS:     3y F/U     REPEAT EXAM:  No           ______________________________     Vania Rea. Jarold Motto, MD, Clement[facility] Graham           CC:  Pecola Lawless, MD           n.     Rosalie DoctorMarland Kitchen   Vania Rea. Lashan Gluth at 01/25/2010 09:42 AM           Malachi Pro, [phone]  Note: An exclamation mark (!) indicates a result that was not dispersed into the flowsheet. Document Creation Date: 01/25/2010 9:44 AM _______________________________________________________________________  (1) Order result status: Final Collection or observation date-time: 01/25/2010 09:33 Requested date-time:  Receipt date-time:  Reported date-time:  Referring Physician:   Ordering Physician: Sheryn Bison [id number]) Specimen Source:  Source: Launa Grill Order Number: [id number] Lab site:   Appended Document: Colonoscopy     Procedures Next Due Date:    Colonoscopy: 01/2013

## 2010-07-19 NOTE — Assessment & Plan Note (Signed)
Summary: flu shot/cbs   Nurse Visit   Allergies: No Known Drug Allergies  Orders Added: 1)  Flu Vaccine 3yrs + MEDICARE PATIENTS [Q2039] 2)  Administration Flu vaccine - MCR [G0008]  Flu Vaccine Consent Questions     Do you have a history of severe allergic reactions to this vaccine? no    Any prior history of allergic reactions to egg and/or gelatin? no    Do you have a sensitivity to the preservative Thimersol? no    Do you have a past history of Guillan-Barre Syndrome? no    Do you currently have an acute febrile illness? no    Have you ever had a severe reaction to latex? no    Vaccine information given and explained to patient? yes    Are you currently pregnant? no    Lot Number:[id number]   Exp Date:12/17/2010   Site Given  Left Deltoid IM 

## 2010-07-19 NOTE — Letter (Signed)
Summary: [facility]  [facility]   Imported By: Lanelle Bal 05/10/2010 08:30:51  _____________________________________________________________________  External Attachment:    Type:   Image     Comment:   External Document

## 2010-07-19 NOTE — Letter (Signed)
Summary: Patient Notice- Polyp Results  [facility]  [address] [place], [place] [address]   Phone: [phone]  Fax: [phone]        January 28, 2010 MRN: [id number]    [facility] [address] [place], [place]  [address]    Dear Mr. Cefalu,  I am pleased to inform you that the colon polyp(s) removed during your recent colonoscopy was (were) found to be benign (no cancer detected) upon pathologic examination.  I recommend you have a repeat colonoscopy examination in 3_ years to look for recurrent polyps, as having colon polyps increases your risk for having recurrent polyps or even colon cancer in the future.  Should you develop new or worsening symptoms of abdominal pain, bowel habit changes or bleeding from the rectum or bowels, please schedule an evaluation with either your primary care physician or with me.  Additional information/recommendations:  _X_ No further action with gastroenterology is needed at this time. Please      follow-up with your primary care physician for your other healthcare      needs.  __ Please call [phone] to schedule a return visit to review your      situation.  __ Please keep your follow-up visit as already scheduled.  __ Continue treatment plan as outlined the day of your exam.  Please call us if you are having persistent problems or have questions about your condition that have not been fully answered at this time.  Sincerely,  Mardella Layman MD [facility]  This letter has been electronically signed by your physician.

## 2010-07-19 NOTE — Letter (Signed)
Summary: [facility]  [facility]   Imported By: Lanelle Bal 01/24/2010 13:14:09  _____________________________________________________________________  External Attachment:    Type:   Image     Comment:   External Document

## 2010-07-19 NOTE — Letter (Signed)
Summary: Colonoscopy Letter  [facility]  [address] [place], [place] [address]   Phone: [phone]  Fax: [phone]      December 14, 2009 MRN: [id number]   [facility] [address] [place], [place]  [address]   Dear Mr. Labarre,   According to your medical record, it is time for you to schedule a Colonoscopy. The American Cancer Society recommends this procedure as a method to detect early colon cancer. Patients with a family history of colon cancer, or a personal history of colon polyps or inflammatory bowel disease are at increased risk.  This letter has beeen generated based on the recommendations made at the time of your procedure. If you feel that in your particular situation this may no longer apply, please contact our office.  Please call our office at [phone] to schedule this appointment or to update your records at your earliest convenience.  Thank you for cooperating with [place] to provide you with the very best care possible.   Sincerely,   Vania Rea. Jarold Motto, M.D.  [facility] [phone]

## 2010-07-19 NOTE — Assessment & Plan Note (Signed)
Summary: SORE THROAT/RH........Marland Kitchen   Vital Signs:  Patient profile:   76 year old male Weight:      190.2 pounds BMI:     28.19 Temp:     98.2 degrees F oral Pulse rate:   72 / minute Resp:     15 per minute BP sitting:   122 / 70  (left arm) Cuff size:   large  Vitals Entered By: Shonna Chock CMA (February 16, 2010 11:53 AM) CC: Sore throat, onset this morning, URI symptoms   CC:  Sore throat, onset this morning, and URI symptoms.  History of Present Illness:  URI Symptoms      This is a 76 year old man who presents with URI symptoms as ST as of this am.  The patient reports sore throat, but denies nasal congestion, purulent nasal discharge, productive cough, and earache.  The patient denies fever.  The patient also reports sneezing 08/30.  The patient denies itchy watery eyes and headache.  The patient denies the following risk factors for Strep sinusitis:  facial pain, tooth pain, and tender adenopathy.  Rx: none  Current Medications (verified): 1)  Daily Multiple Vitamins   Tabs (Multiple Vitamin) 2)  Vitamin E 400 Unit  Caps (Vitamin E) .... Once Daily 3)  Saw Palmetto   Caps (Saw Palmetto (Serenoa Repens) Caps) 4)  Basa .... 2 Tab Once Daily 5)  Pravastatin Sodium 40 Mg  Tabs (Pravastatin Sodium) .... 1/2 Tab Qhs 6)  Fish Oil   Oil (Fish Oil) 7)  [place] 8)  Calcium 600/vitamin D 600-400 Mg-Unit Tabs (Calcium Carbonate-Vitamin D) .Marland Kitchen.. 1 By Mouth Once Daily 9)  Zantac 150 Mg Tabs (Ranitidine Hcl) .Marland Kitchen.. 1 By Mouth Once Daily As Needed 10)  Coq10 100 Mg Caps (Coenzyme Q10) .Marland Kitchen.. 1 By Mouth Once Daily 11)  Tylenol 325 Mg Tabs (Acetaminophen) .... As Needed 12)  L-Methylfolate Calcium 7.5 Mg Tabs (L-Methylfolate) .Marland Kitchen.. 1 Once Daily  Allergies (verified): No Known Drug Allergies  Physical Exam  General:  in no acute distress; alert,appropriate and cooperative throughout examination Ears:  External ear exam shows no significant lesions or deformities.  Otoscopic examination  reveals clear canals, tympanic membranes are intact bilaterally without bulging, retraction, inflammation or discharge. Hearing aid on L Nose:  External nasal examination shows no deformity or inflammation. Nasal mucosa are pink and moist without lesions or exudates. Mouth:  Oral mucosa and oropharynx without lesions or exudates.Minimal pharyngeal erythema.   Lungs:  Normal respiratory effort, chest expands symmetrically. Lungs are clear to auscultation, no crackles or wheezes. Heart:  Normal rate and regular rhythm. S1 and S2 normal without gallop, murmur, click, rub.S4 with slurring Cervical Nodes:  No lymphadenopathy noted Axillary Nodes:  No palpable lymphadenopathy   Impression & Recommendations:  Problem # 1:  PHARYNGITIS-ACUTE (ICD-462)  His updated medication list for this problem includes:    Tylenol 325 Mg Tabs (Acetaminophen) .Marland Kitchen... As needed  Complete Medication List: 1)  Daily Multiple Vitamins Tabs (Multiple vitamin) 2)  Vitamin E 400 Unit Caps (Vitamin e) .... Once daily 3)  Saw Palmetto Caps (Saw palmetto (serenoa repens) caps) 4)  Basa  .... 2 tab once daily 5)  Pravastatin Sodium 40 Mg Tabs (Pravastatin sodium) .... 1/2 tab qhs 6)  Fish Oil Oil (Fish oil) 7)  [place]  8)  Calcium 600/vitamin D 600-400 Mg-unit Tabs (Calcium carbonate-vitamin d) .Marland Kitchen.. 1 by mouth once daily 9)  Zantac 150 Mg Tabs (Ranitidine hcl) .Marland Kitchen.. 1 by mouth once daily  as needed 10)  Coq10 100 Mg Caps (Coenzyme q10) .Marland Kitchen.. 1 by mouth once daily 11)  Tylenol 325 Mg Tabs (Acetaminophen) .... As needed 12)  L-methylfolate Calcium 7.5 Mg Tabs (L-methylfolate) .Marland Kitchen.. 1 once daily  Other Orders: Rapid Strep (11914)  Patient Instructions: 1)  Zicam Melts as needed for sore throat. Vitamin C 2000 mg once daily  & Echinacea as per bottle.Drink as much fluid as you can tolerate for the next few days. Neti pot once daily if sinus congestion occurs. Report pain , pus & fever as discussed.  Laboratory Results     Other Tests  Rapid Strep: negative

## 2010-07-19 NOTE — Progress Notes (Signed)
Summary: [facility]  Phone Note Call from Patient   Caller: Spouse- Vicky Summary of Call: Spouse calls and states that her and pt were up all night due to pts coughing. Pt was seen by Dr. Alwyn Ren yesterday and is not getting any better. He is coughing up yellow "stuff". Please advise. [phone] Initial call taken by: Lavell Islam,  February 17, 2010 12:57 PM  Follow-up for Phone Call        per dr hopper z-pak if not allergic, PROMETHAZINE-CODEINE. spoke with pt wife she states pt has no allergy, rx sent to pharmacy..............Marland KitchenFelecia Deloach CMA  February 17, 2010 2:15 PM     New/Updated Medications: PROMETHAZINE-CODEINE 6.25-10 MG/5ML SYRP (PROMETHAZINE-CODEINE) 1 tsp every 6 hours as needed ZITHROMAX Z-PAK 250 MG TABS (AZITHROMYCIN) as directed Prescriptions: ZITHROMAX Z-PAK 250 MG TABS (AZITHROMYCIN) as directed  #1 x 0   Entered by:   Jeremy Johann CMA   Authorized by:   Marga Melnick MD   Signed by:   Jeremy Johann CMA on 02/17/2010   Method used:   Faxed to ...       [facility]  [address] [id number])       [address]       [place], [place]  [address]       Ph: [phone] or [phone]       Fax: [phone]   RxID:   [id number] PROMETHAZINE-CODEINE 6.25-10 MG/5ML SYRP (PROMETHAZINE-CODEINE) 1 tsp every 6 hours as needed  #90cc x 0   Entered by:   Jeremy Johann CMA   Authorized by:   Marga Melnick MD   Signed by:   Jeremy Johann CMA on 02/17/2010   Method used:   Printed then faxed to ...       [facility]  [address] [id number]       [address]       [place], [place]  [address]       Ph: [phone] or [phone]       Fax: [phone]   RxID:   [id number]

## 2010-07-19 NOTE — Progress Notes (Signed)
Summary: ? re prep   Phone Note Call from Patient Call back at Home Phone [phone]   Caller: Patient Call For: Dr Jarold Motto Reason for Call: Talk to Nurse Summary of Call: Patient wants to speak to nurse regarding colon scheduled for next week. Initial call taken by: Tawni Levy,  January 17, 2010 3:55 PM  Follow-up for Phone Call        Wife just asked if this was necessary because he was "confused" about this, I stated that if he received letter from Dr.Patterson that it was time to repeat colon exam than this is important and that he should have this procedure, patient has HX of colon CA.  Wife seems pleased with answer and understands this. Follow-up by: Sherren Kerns RN,  January 17, 2010 4:20 PM

## 2010-07-19 NOTE — Letter (Signed)
Summary: [facility] Instructions  [facility]  [address] [place], [place] [address]   Phone: [phone]  Fax: [phone]       Manuel Evans    1934/10/10    MRN: [id number]        Procedure Day /Date:  01/25/10   Tuesday     Arrival Time:  8:00am      Procedure Time:  9:00am     Location of Procedure:                    _x _  [facility] (4th Floor)                        PREPARATION FOR COLONOSCOPY WITH MOVIPREP   Starting 5 days prior to your procedure _ 8/4/11_ do not eat nuts, seeds, popcorn, corn, beans, peas,  salads, or any raw vegetables.  Do not take any fiber supplements (e.g. Metamucil, Citrucel, and Benefiber).  THE DAY BEFORE YOUR PROCEDURE         DATE:   01/24/10  DAY:  Monday  1.  Drink clear liquids the entire day-NO SOLID FOOD  2.  Do not drink anything colored red or purple.  Avoid juices with pulp.  No orange juice.  3.  Drink at least 64 oz. (8 glasses) of fluid/clear liquids during the day to prevent dehydration and help the prep work efficiently.  CLEAR LIQUIDS INCLUDE: Water Jello Ice Popsicles Tea (sugar ok, no milk/cream) Powdered fruit flavored drinks Coffee (sugar ok, no milk/cream) Gatorade Juice: apple, white grape, white cranberry  Lemonade Clear bullion, consomm, broth Carbonated beverages (any kind) Strained chicken noodle soup Hard Candy                             4.  In the morning, mix first dose of MoviPrep solution:    Empty 1 Pouch A and 1 Pouch B into the disposable container    Add lukewarm drinking water to the top line of the container. Mix to dissolve    Refrigerate (mixed solution should be used within 24 hrs)  5.  Begin drinking the prep at 5:00 p.m. The MoviPrep container is divided by 4 marks.   Every 15 minutes drink the solution down to the next mark (approximately 8 oz) until the full liter is complete.   6.  Follow completed prep with 16 oz of clear liquid of your  choice (Nothing red or purple).  Continue to drink clear liquids until bedtime.  7.  Before going to bed, mix second dose of MoviPrep solution:    Empty 1 Pouch A and 1 Pouch B into the disposable container    Add lukewarm drinking water to the top line of the container. Mix to dissolve    Refrigerate  THE DAY OF YOUR PROCEDURE      DATE:  01/25/10  DAY:  Tuesday  Beginning at  4:00 a.m. (5 hours before procedure):         1. Every 15 minutes, drink the solution down to the next mark (approx 8 oz) until the full liter is complete.  2. Follow completed prep with 16 oz. of clear liquid of your choice.    3. You may drink clear liquids until 7:00am   (2 HOURS BEFORE PROCEDURE).   MEDICATION INSTRUCTIONS  Unless otherwise instructed, you should take regular prescription medications with a  small sip of water   as early as possible the morning of your procedure.         OTHER INSTRUCTIONS  You will need a responsible adult at least 74 years of age to accompany you and drive you home.   This person must remain in the waiting room during your procedure.  Wear loose fitting clothing that is easily removed.  Leave jewelry and other valuables at home.  However, you may wish to bring a book to read or  an iPod/MP3 player to listen to music as you wait for your procedure to start.  Remove all body piercing jewelry and leave at home.  Total time from sign-in until discharge is approximately 2-3 hours.  You should go home directly after your procedure and rest.  You can resume normal activities the  day after your procedure.  The day of your procedure you should not:   Drive   Make legal decisions   Operate machinery   Drink alcohol   Return to work  You will receive specific instructions about eating, activities and medications before you leave.    The above instructions have been reviewed and explained to me by   Ulis Rias RN  January 11, 2010 9:22 AM     I fully  understand and can verbalize these instructions _____________________________ Date _________

## 2010-08-05 ENCOUNTER — Ambulatory Visit: Payer: Self-pay | Admitting: Internal Medicine

## 2010-08-15 ENCOUNTER — Encounter: Payer: Self-pay | Admitting: Internal Medicine

## 2010-08-15 ENCOUNTER — Ambulatory Visit (INDEPENDENT_AMBULATORY_CARE_PROVIDER_SITE_OTHER): Payer: Medicare Other | Admitting: Internal Medicine

## 2010-08-15 DIAGNOSIS — K219 Gastro-esophageal reflux disease without esophagitis: Secondary | ICD-10-CM

## 2010-08-15 DIAGNOSIS — E785 Hyperlipidemia, unspecified: Secondary | ICD-10-CM

## 2010-08-17 ENCOUNTER — Encounter (INDEPENDENT_AMBULATORY_CARE_PROVIDER_SITE_OTHER): Payer: Self-pay | Admitting: *Deleted

## 2010-08-17 ENCOUNTER — Other Ambulatory Visit (INDEPENDENT_AMBULATORY_CARE_PROVIDER_SITE_OTHER): Payer: Medicare Other

## 2010-08-17 ENCOUNTER — Other Ambulatory Visit: Payer: Self-pay | Admitting: Internal Medicine

## 2010-08-17 DIAGNOSIS — E785 Hyperlipidemia, unspecified: Secondary | ICD-10-CM

## 2010-08-17 LAB — BASIC METABOLIC PANEL
BUN: 12 mg/dL (ref 6–23)
Chloride: 107 mEq/L (ref 96–112)
GFR: 87.79 mL/min (ref >60.00)
Potassium: 4.4 mEq/L (ref 3.5–5.1)
Sodium: 142 mEq/L (ref 135–145)

## 2010-08-17 LAB — LIPID PANEL
Cholesterol: 173 mg/dL (ref 0–200)
LDL Cholesterol: 112 mg/dL — ABNORMAL HIGH (ref 0–99)

## 2010-08-17 LAB — ALT: ALT: 20 U/L (ref 0–53)

## 2010-08-25 NOTE — Assessment & Plan Note (Signed)
Summary: 6 month followup///sph   Vital Signs:  Patient profile:   76 year old male Weight:      191.6 pounds Pulse rate:   60 / minute Resp:     13 per minute BP sitting:   110 / 74  (left arm) Cuff size:   large  Vitals Entered By: Shonna Chock CMA (August 15, 2010 10:31 AM) CC: 6 month follow-up, fasting if labs needed, Heartburn   CC:  6 month follow-up, fasting if labs needed, and Heartburn.  History of Present Illness:    GERD F/U:  Off Ranitidine Mosetta Putt  denies acid reflux, sour taste in mouth, epigastric pain, chest pain, trouble swallowing, weight loss, and weight gain.  The patient denies the following alarm features: melena, dysphagia, hematemesis, and vomiting.      Hyperlipidemia Follow-Up: He  denies muscle aches, abdominal pain, flushing, itching, constipation, diarrhea, and fatigue.  The patient denies the following symptoms: dypsnea and palpitations.  Compliance with medications (by patient report) has been near 100%.  Dietary compliance has been good.  The patient reports exercising daily.  Adjunctive measures currently used by the patient include fish oil supplements.    Current Medications (verified): 1)  Daily Multiple Vitamins   Tabs (Multiple Vitamin) 2)  Vitamin E 400 Unit  Caps (Vitamin E) .... Once Daily 3)  Saw Palmetto   Caps (Saw Palmetto (Serenoa Repens) Caps) 4)  Basa .... 2 Tab Once Daily 5)  Pravastatin Sodium 40 Mg  Tabs (Pravastatin Sodium) .... 1/2 Tab Qhs 6)  Fish Oil   Oil (Fish Oil) 7)  [place] 8)  Calcium 600/vitamin D 600-400 Mg-Unit Tabs (Calcium Carbonate-Vitamin D) .Marland Kitchen.. 1 By Mouth Once Daily 9)  Zantac 150 Mg Tabs (Ranitidine Hcl) .Marland Kitchen.. 1 By Mouth Once Daily As Needed 10)  Coq10 100 Mg Caps (Coenzyme Q10) .Marland Kitchen.. 1 By Mouth Once Daily 11)  Tylenol 325 Mg Tabs (Acetaminophen) .... As Needed 12)  L-Methylfolate Calcium 7.5 Mg Tabs (L-Methylfolate) .Marland Kitchen.. 1 Once Daily  Allergies (verified): No Known Drug Allergies  Physical Exam  General:   well-nourished; alert,appropriate and cooperative throughout examination Eyes:  No  icterus Mouth:  Oral mucosa and oropharynx without lesions or exudates.  Teeth in good repair. Mild pharyngeal erythema.   Lungs:  Normal respiratory effort, chest expands symmetrically. Lungs are clear to auscultation, no crackles or wheezes. Heart:  Normal rate and regular rhythm. S1 and S2 normal without gallop, murmur, click, rub .S4 Abdomen:  Bowel sounds positive,abdomen soft and non-tender without masses, organomegaly or hernias noted. Op scar well healed Pulses:  R and L carotid,radial,dorsalis pedis and posterior tibial pulses are full and equal bilaterally Extremities:  No clubbing, cyanosis, edema.  Neurologic:  alert & oriented X3.   Skin:  Intact without suspicious lesions or rashes. No jaundice Psych:  Focused & intelligent   Impression & Recommendations:  Problem # 1:  HYPERLIPIDEMIA (ICD-272.4)  His updated medication list for this problem includes:    Pravastatin Sodium 40 Mg Tabs (Pravastatin sodium) .Marland Kitchen... 1/2 tab qhs  Orders: Venipuncture (16109) TLB-BMP (Basic Metabolic Panel-BMET) (80048-METABOL) TLB-Lipid Panel (80061-LIPID) TLB-ALT (SGPT) (84460-ALT) TLB-AST (SGOT) (84450-SGOT)  Problem # 2:  GERD (ICD-530.81) asymptomatic off Ranitidine His updated medication list for this problem includes:    Zantac 150 Mg Tabs (Ranitidine hcl) .Marland Kitchen... 1 by mouth once daily as needed  Complete Medication List: 1)  Daily Multiple Vitamins Tabs (Multiple vitamin) 2)  Vitamin E 400 Unit Caps (Vitamin e) .... Once daily 3)  Saw Palmetto Caps (Saw palmetto (serenoa repens) caps) 4)  Basa  .... 2 tab once daily 5)  Pravastatin Sodium 40 Mg Tabs (Pravastatin sodium) .... 1/2 tab qhs 6)  Fish Oil Oil (Fish oil) 7)  [place]  8)  Calcium 600/vitamin D 600-400 Mg-unit Tabs (Calcium carbonate-vitamin d) .Marland Kitchen.. 1 by mouth once daily 9)  Zantac 150 Mg Tabs (Ranitidine hcl) .Marland Kitchen.. 1 by mouth once daily as  needed 10)  Coq10 100 Mg Caps (Coenzyme q10) .Marland Kitchen.. 1 by mouth once daily 11)  Tylenol 325 Mg Tabs (Acetaminophen) .... As needed 12)  L-methylfolate Calcium 7.5 Mg Tabs (L-methylfolate) .Marland Kitchen.. 1 once daily  Patient Instructions: 1)  Avoid foods high in acid (tomatoes, citrus juices, spicy foods). Avoid eating within two hours of lying down or before exercising. Do not over eat; try smaller more frequent meals. Elevate head of bed twelve inches when sleeping. Ranitidine every 12 hrs pre  meals as needed .   Orders Added: 1)  Est. Patient Level III [81191] 2)  Venipuncture [36415] 3)  TLB-BMP (Basic Metabolic Panel-BMET) [80048-METABOL] 4)  TLB-Lipid Panel [80061-LIPID] 5)  TLB-ALT (SGPT) [84460-ALT] 6)  TLB-AST (SGOT) [84450-SGOT]

## 2010-11-16 ENCOUNTER — Telehealth: Payer: Self-pay | Admitting: Internal Medicine

## 2010-11-16 ENCOUNTER — Encounter: Payer: Self-pay | Admitting: Internal Medicine

## 2010-11-16 ENCOUNTER — Ambulatory Visit (INDEPENDENT_AMBULATORY_CARE_PROVIDER_SITE_OTHER): Payer: Medicare Other | Admitting: Internal Medicine

## 2010-11-16 DIAGNOSIS — E785 Hyperlipidemia, unspecified: Secondary | ICD-10-CM

## 2010-11-16 DIAGNOSIS — E162 Hypoglycemia, unspecified: Secondary | ICD-10-CM

## 2010-11-16 DIAGNOSIS — R413 Other amnesia: Secondary | ICD-10-CM
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
## 2010-11-16 NOTE — Patient Instructions (Signed)
Please keep Neurology referral

## 2010-11-17 NOTE — Telephone Encounter (Signed)
There are no specific  MVI as  Treatment or prevention for Alzheimer's. Gingo Biloba Extract was not effective in study @ [address]

## 2010-11-18 NOTE — Telephone Encounter (Signed)
Spoke w/ pt wife aware of recommendations.

## 2011-01-24 ENCOUNTER — Encounter: Payer: Self-pay | Admitting: Internal Medicine

## 2011-02-01 ENCOUNTER — Other Ambulatory Visit: Payer: Self-pay | Admitting: Neurology

## 2011-02-01 DIAGNOSIS — R413 Other amnesia: Secondary | ICD-10-CM

## 2011-02-06 ENCOUNTER — Ambulatory Visit (INDEPENDENT_AMBULATORY_CARE_PROVIDER_SITE_OTHER): Payer: Medicare Other | Admitting: Internal Medicine

## 2011-02-06 ENCOUNTER — Other Ambulatory Visit: Payer: Self-pay | Admitting: Neurology

## 2011-02-06 DIAGNOSIS — R413 Other amnesia: Secondary | ICD-10-CM

## 2011-02-06 DIAGNOSIS — R412 Retrograde amnesia: Secondary | ICD-10-CM

## 2011-02-06 NOTE — Patient Instructions (Signed)
A copy of this note will be faxed to Dr. Clarisa Kindred office. I strongly recommend that you allow his office to schedule the MRI as recommended.

## 2011-02-06 NOTE — Progress Notes (Signed)
  Subjective:    Patient ID: Manuel Evans, male    DOB: 22-May-1935, 76 y.o.   MRN: [id number]  HPI He returns  after seeing the neurologist who evaluated him for memory loss. That office visit was reviewed. He told Dr. Anne Hahn at that time  memory issues had  improved and he believes they were due to  stress related to his wife's illness. MMSE was abnormal @ that appt , prompting recommendation for MRI by Dr Anne Hahn  At this time he states he does not remember seeing Dr. Anne Hahn. He questions the need for MRI is recommended in that note as "my tests were normal".    Review of Systems     Objective:   Physical Exam Gen.: Healthy and well-nourished in appearance. Alert and cooperative throughout exam. Lungs: Normal respiratory effort; chest expands symmetrically. Lungs are clear to auscultation without rales, wheezes, or increased work of breathing. Heart: Normal rate and rhythm. Normal S1 and S2. No gallop, click, or rub. S4 w/o  murmur. Neurologic: Alert and oriented x3. Deep tendon reflexes symmetrical and normal . Clock test was completed correctly. He was able to name 10 animals in less than 60 seconds.         Skin: Intact without suspicious lesions or rashes. Lymph: No cervical, axillary  lymphadenopathy present. Psych: Mood and affect are normal. Normally interactive                                                                                         Assessment & Plan:  #1 memory loss? Stress related.  #2 amnesia for neurologic evaluation by Dr. Anne Hahn  Plan: MRI certainly is indicated in view of this confusing picture.

## 2011-02-13 ENCOUNTER — Ambulatory Visit
Admission: RE | Admit: 2011-02-13 | Discharge: 2011-02-13 | Disposition: A | Discharge status: Home / Self Care | Payer: Medicare Other | Source: Ambulatory Visit | Attending: Neurology | Admitting: Neurology

## 2011-02-13 DIAGNOSIS — R413 Other amnesia: Secondary | ICD-10-CM

## 2011-02-24 LAB — HEAVY METALS SCREEN, URINE: Arsenic, 24H Ur: 3 mcg/L (ref <81)

## 2011-03-30 ENCOUNTER — Ambulatory Visit: Payer: Medicare Other

## 2011-03-30 ENCOUNTER — Ambulatory Visit (INDEPENDENT_AMBULATORY_CARE_PROVIDER_SITE_OTHER): Payer: Medicare Other | Admitting: *Deleted

## 2011-03-30 VITALS — Temp 98.7°F

## 2011-03-30 DIAGNOSIS — Z23 Encounter for immunization: Secondary | ICD-10-CM

## 2011-04-17 ENCOUNTER — Ambulatory Visit (INDEPENDENT_AMBULATORY_CARE_PROVIDER_SITE_OTHER): Payer: Medicare Other | Admitting: Internal Medicine

## 2011-04-17 ENCOUNTER — Encounter: Payer: Self-pay | Admitting: Internal Medicine

## 2011-04-17 VITALS — BP 132/78 | HR 58 | Temp 97.8°F | Resp 12 | Ht 68.0 in | Wt 195.0 lb

## 2011-04-17 DIAGNOSIS — R7989 Other specified abnormal findings of blood chemistry: Secondary | ICD-10-CM

## 2011-04-17 DIAGNOSIS — Z0000 Encounter for general adult medical examination without abnormal findings: Secondary | ICD-10-CM

## 2011-04-17 DIAGNOSIS — R972 Elevated prostate specific antigen [PSA]: Secondary | ICD-10-CM

## 2011-04-17 DIAGNOSIS — R413 Other amnesia: Secondary | ICD-10-CM

## 2011-04-17 DIAGNOSIS — E785 Hyperlipidemia, unspecified: Secondary | ICD-10-CM

## 2011-04-17 DIAGNOSIS — Z85038 Personal history of other malignant neoplasm of large intestine: Secondary | ICD-10-CM

## 2011-04-17 LAB — CBC WITH DIFFERENTIAL/PLATELET
Basophils Absolute: 0 10*3/uL (ref 0.0–0.1)
Eosinophils Absolute: 0.8 10*3/uL — ABNORMAL HIGH (ref 0.0–0.7)
Hemoglobin: 14.2 g/dL (ref 13.0–17.0)
Lymphocytes Relative: 24.3 % (ref 12.0–46.0)
MCHC: 33.7 g/dL (ref 30.0–36.0)
Neutro Abs: 3.5 10*3/uL (ref 1.4–7.7)
Platelets: 225 10*3/uL (ref 150.0–400.0)
RDW: 13.3 % (ref 11.5–14.6)

## 2011-04-17 LAB — BASIC METABOLIC PANEL
BUN: 9 mg/dL (ref 6–23)
CO2: 29 mEq/L (ref 19–32)
Calcium: 9.1 mg/dL (ref 8.4–10.5)
Creatinine, Ser: 1 mg/dL (ref 0.4–1.5)
Glucose, Bld: 108 mg/dL — ABNORMAL HIGH (ref 70–99)
Sodium: 141 mEq/L (ref 135–145)

## 2011-04-17 LAB — LIPID PANEL
HDL: 42.5 mg/dL (ref >39.00)
VLDL: 33 mg/dL (ref 0.0–40.0)

## 2011-04-17 LAB — TSH: TSH: 1.34 u[IU]/mL (ref 0.35–5.50)

## 2011-04-17 LAB — HEPATIC FUNCTION PANEL
AST: 27 U/L (ref 0–37)
Albumin: 4.3 g/dL (ref 3.5–5.2)

## 2011-04-17 LAB — HEMOGLOBIN A1C: Hgb A1c MFr Bld: 5.8 % (ref 4.6–6.5)
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
## 2011-04-17 NOTE — Patient Instructions (Signed)
Preventive Health Care: Exercise at least 30-45 minutes a day,  3-4 days a week.  Eat a low-fat diet with lots of fruits and vegetables, up to 7-9 servings per day.Consume less than 40 grams of sugar per day from foods & drinks with High Fructose Corn Sugar as # 1,2,3 or # 4 on label. Health Care Power of Attorney & Living Will. Complete if not in place ; these place you in charge of your health care decisions. Please keep up with current events and dates until seen by the Neurologist.

## 2011-04-18 ENCOUNTER — Encounter: Payer: Self-pay | Admitting: Neurology

## 2011-04-19 ENCOUNTER — Ambulatory Visit: Payer: Medicare Other | Admitting: Neurology

## 2011-05-05 ENCOUNTER — Encounter: Payer: Self-pay | Admitting: Neurology

## 2011-05-05 ENCOUNTER — Ambulatory Visit (INDEPENDENT_AMBULATORY_CARE_PROVIDER_SITE_OTHER): Payer: Medicare Other | Admitting: Neurology

## 2011-05-05 ENCOUNTER — Other Ambulatory Visit (INDEPENDENT_AMBULATORY_CARE_PROVIDER_SITE_OTHER): Payer: Medicare Other

## 2011-05-05 VITALS — BP 136/76 | HR 72 | Ht 68.0 in | Wt 197.0 lb

## 2011-05-05 DIAGNOSIS — R413 Other amnesia: Secondary | ICD-10-CM

## 2011-05-05 NOTE — Patient Instructions (Signed)
Go to the basement to have your labs drawn today.  Call in January to schedule an appointment for memory testing.
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
## 2011-05-08 ENCOUNTER — Telehealth: Payer: Self-pay

## 2011-05-08 NOTE — Telephone Encounter (Signed)
Message copied by Lelon Huh on Mon May 08, 2011 12:37 PM ------      Message from: Milas Gain      Created: Mon May 08, 2011 10:19 AM       Jan - Please let Mr. Rajewski know that his B12 level was normal.

## 2011-05-08 NOTE — Telephone Encounter (Signed)
Informed pt's wife of normal B12 level.  Pt is taking otc B12 500mg .

## 2011-06-26 ENCOUNTER — Other Ambulatory Visit: Payer: Self-pay

## 2011-06-26 DIAGNOSIS — R413 Other amnesia: Secondary | ICD-10-CM

## 2011-06-27 ENCOUNTER — Ambulatory Visit (INDEPENDENT_AMBULATORY_CARE_PROVIDER_SITE_OTHER): Payer: Medicare Other | Admitting: Internal Medicine

## 2011-06-27 ENCOUNTER — Encounter: Payer: Self-pay | Admitting: Internal Medicine

## 2011-06-27 ENCOUNTER — Telehealth: Payer: Self-pay | Admitting: Neurology

## 2011-06-27 VITALS — BP 122/80 | HR 88 | Temp 98.0°F | Wt 192.0 lb

## 2011-06-27 DIAGNOSIS — J209 Acute bronchitis, unspecified: Secondary | ICD-10-CM

## 2011-06-27 MED ORDER — AZITHROMYCIN 250 MG PO TABS: ORAL_TABLET | ORAL | Status: AC

## 2011-06-27 MED ORDER — HYDROCODONE-HOMATROPINE 5-1.5 MG/5ML PO SYRP: 5.0000 mL | ORAL_SOLUTION | Freq: Four times a day (QID) | ORAL | Status: AC | PRN

## 2011-06-27 NOTE — Telephone Encounter (Signed)
Pt's wife would like [place] to call Dr. Leonides Cave and find out when his appointment is going to be scheduled.

## 2011-06-27 NOTE — Patient Instructions (Signed)
Plain Mucinex for thick secretions ;force NON dairy fluids. Use a Neti pot daily as needed for sinus congestion  

## 2011-06-27 NOTE — Progress Notes (Signed)
  Subjective:    Patient ID: Manuel Evans, male    DOB: 1935-04-14, 77 y.o.   MRN: [id number]  HPI  COUGH  Onset:06/17/2012 Trigger:"tickling "in throat Course:progressive, sleep disturbed Treatment/efficacy:Robitussin , Delsym don't help long term Associated symptoms/signs:  URI symptoms: No facial pain, frontal headaches, nasal purulence, dental pain,sorethroat, or ear ache/otic discharge Extrinsic symptoms: No itchy eyes or angioedema. Paroxysms of sneezing Infectious symptoms: No fever, chills, sweats, and purulent secretions Chest symptoms: No pleuritic pain, sputum production, hemoptysis, or dyspnea.Some wheezing GI symptoms: No dyspepsia, dysphagia, reflux symptoms Occupational/environmental exposures:no Smoking history:quit 1974 ACE inhibitor administration:no Past medical history/family history pulmonary disease: no    Review of Systems     Objective:   Physical Exam General appearance:good health ;well nourished; no acute distress or increased work of breathing is present.  No  lymphadenopathy about the head, neck, or axilla noted.   Eyes: No conjunctival inflammation or lid edema is present.  Ears:  External ear exam shows no significant lesions or deformities.  Otoscopic examination reveals clear canals, tympanic membranes are intact bilaterally without bulging, retraction, inflammation or discharge. Aid worn on L  Nose:  External nasal examination shows no deformity or inflammation. Nasal mucosa are pink and moist without lesions or exudates. L septal  deviation.No obstruction to airflow.   Oral exam: Dental hygiene is good; lips and gums are healthy appearing.There is no oropharyngeal erythema or exudate noted. Upper partial     Heart:  Normal rate and regular rhythm. S1 and S2 normal without gallop, click, rub or other extra sounds. Grade 1/6 systolic murmur   Lungs:Chest clear to auscultation; no wheezes, rhonchi,rales ,or rubs present.No increased work  of breathing.    Extremities:  No cyanosis, edema, or clubbing  noted    Skin: Warm but with slight diaphoresis         Assessment & Plan:  #1 bronchitis, atypical organism suggested by history and exam  Plan: See orders& recommendations

## 2011-06-27 NOTE — Telephone Encounter (Signed)
Called and spoke with the patient's wife. She states she called and spoke with Dr. Leonides Cave re: appointment for her husband's memory testing and he reported that he was having trouble with their insurance co., [employer] and the authorization process. [name] said that she understood Dr. Leonides Cave to say that maybe her husband just needed to come back and see Dr. Modesto Charon. I told her that the referral was just faxed to Dr. Leonides Cave yesterday (06/26/11) and that perhaps he needed a little more time to work on the authorization. [name] insisted that I let Dr. Modesto Charon know what was going on and to see if he wanted to see her husband in the office or as he mentioned at the last appointment, change his medication. I told her I would. She also asked that we let her know what Dr. Modesto Charon says. **Dr. Modesto Charon, please advise any med change and/or follow up appointment. The patient does not have an appointment with Dr. Leonides Cave yet. Thank you.

## 2011-06-27 NOTE — Telephone Encounter (Signed)
I first called Dr. Maxwell Marion office and spoke with a Diplomatic Services operational officer. I explained the situation to her and she in turn asked Dr. Leonides Cave while I was on hold. Dr. Leonides Cave has faxed clinical information to the insurance company and will now just wait to hear from them re: authorization. I then called the patient's wife and let her a detailed message asking that she wait to hear from Dr. Leonides Cave and that it may take a few days. I told her to call us if she had any further questions.

## 2011-06-27 NOTE — Telephone Encounter (Signed)
Jan - Send Dr. Leonides Cave a note to see what he actually told Ms. Slivinski.  I am happy to see them back if it looks like Dr Leonides Cave can't get the NP testing approved but if he thinks he can, then having Zelson see the patient first would be ideal.  Does that work?

## 2011-06-27 NOTE — Telephone Encounter (Signed)
Pt's wife spoke with Dr. Leonides Cave who told her that he was having trouble "getting information" from pt's insurance company. Most likely a pre-certification? But wife was unsure. She is very anxious to get everything settled and wants to know if she should just schedule a fu appt for the pt with dr. Modesto Charon instead. She requests that you return her call.

## 2011-07-03 ENCOUNTER — Telehealth: Payer: Self-pay | Admitting: Internal Medicine

## 2011-07-03 MED ORDER — DOXYCYCLINE HYCLATE 100 MG PO TABS: 100.0000 mg | ORAL_TABLET | Freq: Two times a day (BID) | ORAL | Status: AC

## 2011-07-03 NOTE — Telephone Encounter (Signed)
Patients wife states that patient was was seen last week for bronchitis. He has finished his antibiotics and cough syrup but is not feeling any better. Patient wife states that he still feels nauseated,coughing, and fatigued. He would like something else called in to [facility] [address]. Please advise.

## 2011-07-03 NOTE — Telephone Encounter (Signed)
Doxycycline 100 mg twice a day, dispense 14. Avoid direct sunlight on his

## 2011-07-03 NOTE — Telephone Encounter (Signed)
Patient Informed. Rx Done.

## 2011-07-11 ENCOUNTER — Other Ambulatory Visit: Payer: Self-pay | Admitting: Neurology

## 2011-07-11 MED ORDER — DONEPEZIL HCL 5 MG PO TABS: 5.0000 mg | ORAL_TABLET | Freq: Every day | ORAL | Status: DC

## 2011-07-12 ENCOUNTER — Telehealth: Payer: Self-pay

## 2011-07-12 NOTE — Telephone Encounter (Signed)
Spoke with pt's wife and gave her appt info [facility] 07/31/11 at 8:30 for memory testing.

## 2011-08-04 ENCOUNTER — Ambulatory Visit: Payer: Medicare Other | Admitting: Neurology

## 2011-08-23 ENCOUNTER — Ambulatory Visit (INDEPENDENT_AMBULATORY_CARE_PROVIDER_SITE_OTHER): Payer: Medicare Other | Admitting: Neurology

## 2011-08-23 ENCOUNTER — Encounter: Payer: Self-pay | Admitting: Neurology

## 2011-08-23 VITALS — BP 128/70 | HR 62 | Wt 195.0 lb

## 2011-08-23 DIAGNOSIS — F0280 Dementia in other diseases classified elsewhere without behavioral disturbance: Secondary | ICD-10-CM

## 2011-08-23 DIAGNOSIS — G309 Alzheimer's disease, unspecified: Secondary | ICD-10-CM

## 2011-08-23 DIAGNOSIS — F068 Other specified mental disorders due to known physiological condition: Secondary | ICD-10-CM

## 2011-08-23 MED ORDER — DONEPEZIL HCL 10 MG PO TABS: 10.0000 mg | ORAL_TABLET | Freq: Every day | ORAL | Status: DC

## 2011-08-23 NOTE — Progress Notes (Signed)
   Dear Dr. Alwyn Ren,  I saw  Manuel Evans back in [facility] clinic for his problem with memory.  In the interim, he has had NP testing by Dr. Jacquelyne Balint.  Dr. Jacquelyne Balint felt he has dementia, likely Alzheimer's type and I agree.  He has had no change since I saw him last.  He is tolerating the Aricept 5mg  which he takes around 3:00 p.m. because taking it at night time made him "nervous and shaky".  He has not had any problem with hallucinations or agitation.  He continues to drive, although is almost always accompanied by his wife who cannot drive.  He does get confused if he is driving some where where he has not been before.    Medical history, social history, and family history were reviewed and have not changed since the last clinic visit.  Current Outpatient Prescriptions on File Prior to Visit  Medication Sig Dispense Refill  . acetaminophen (TYLENOL) 325 MG tablet Take 650 mg by mouth as needed.        Marland Kitchen aspirin 81 MG tablet Take 81 mg by mouth daily.       . Calcium Carbonate-Vitamin D (CALCIUM + D PO) Take 600 mg by mouth daily.        . Coenzyme Q10 (COQ10) 100 MG CAPS Take by mouth daily.        . Magnesium Chloride (SLOW-MAG PO) Take by mouth daily.        . Omega-3 Fatty Acids (FISH OIL) 1000 MG CAPS Take by mouth daily.        . ranitidine (ZANTAC) 150 MG capsule Take 150 mg by mouth daily.        . saw palmetto 160 MG capsule Take 160 mg by mouth daily.        . vitamin E 400 UNIT capsule Take 200 Units by mouth daily.         No Known Allergies  ROS:  13 systems were reviewed and are unremarkable.  Exam: . Filed Vitals:   08/23/11 1009  BP: 128/70  Pulse: 62  Weight: 195 lb (88.451 kg)    In general, well appearing man.   MMSE 2/5 time; 4/5 place 3/3 registration; 5/5 WORLD; 0/3 recall; 2/2 naming; 1/1 repetition; 3/3 command; 0/1 figure(not enough sides); 1/1 writing; 1/1 reading 22/30  Impression/Recommendations:  1.  Probable Alzheimer's  dementia - I will increase his Aricept to 10mg  daily.  I am worried about his driving, but I think he is ok for now as long as his wife is in the car with him for any trips that are unfamiliar.  We will see the patient back in 3 months.  [name], MD [facility], [place]

## 2011-09-01 ENCOUNTER — Other Ambulatory Visit: Payer: Medicare Other

## 2011-11-29 ENCOUNTER — Ambulatory Visit (INDEPENDENT_AMBULATORY_CARE_PROVIDER_SITE_OTHER): Payer: Medicare Other | Admitting: Neurology

## 2011-11-29 ENCOUNTER — Encounter: Payer: Self-pay | Admitting: Neurology

## 2011-11-29 VITALS — BP 136/78 | HR 60 | Ht 68.0 in | Wt 190.0 lb

## 2011-11-29 DIAGNOSIS — F0280 Dementia in other diseases classified elsewhere without behavioral disturbance: Secondary | ICD-10-CM

## 2011-11-29 DIAGNOSIS — F068 Other specified mental disorders due to known physiological condition: Secondary | ICD-10-CM

## 2011-11-29 NOTE — Progress Notes (Signed)
Dear Dr. Alwyn Ren,  I saw  Manuel Evans back in [facility] clinic for his problem with his probable Alzheimer's dementia.  At his last visit I increased his Aricept to 10mg  daily.  He takes it at 1:30 p.m. as it was making him "nervous and shaky" at night.  He feels he is doing better on the higher dose of Aricept.  No significant side effects.    He does get irritated when he gets confused.  He feels nervous when he get's turned around in his car.  His wife always accompanies him in the car and says his driving has been "good".  It sounds like he had some problems with anxiety in the past.    No hallucinations, delusions or violent outbursts.  Medical history, social history, and family history were reviewed and have not changed since the last clinic visit.  Current Outpatient Prescriptions on File Prior to Visit  Medication Sig Dispense Refill  . acetaminophen (TYLENOL) 325 MG tablet Take 650 mg by mouth as needed.        Marland Kitchen aspirin 81 MG tablet Take 81 mg by mouth daily.       . Calcium Carbonate-Vitamin D (CALCIUM + D PO) Take 600 mg by mouth daily.        . Coenzyme Q10 (COQ10) 100 MG CAPS Take by mouth daily.        Marland Kitchen donepezil (ARICEPT) 10 MG tablet Take 1 tablet (10 mg total) by mouth daily.  30 tablet  3  . Ginkgo Biloba 40 MG TABS Take 120 mg by mouth daily.      . Magnesium Chloride (SLOW-MAG PO) Take by mouth daily.        . Omega-3 Fatty Acids (FISH OIL) 1000 MG CAPS Take by mouth daily.        . ranitidine (ZANTAC) 150 MG capsule Take 150 mg by mouth daily.        . saw palmetto 160 MG capsule Take 160 mg by mouth daily.        . vitamin B-12 (CYANOCOBALAMIN) 500 MCG tablet Take 500 mcg by mouth daily.      . vitamin E 400 UNIT capsule Take 200 Units by mouth daily.         No Known Allergies  ROS:  13 systems were reviewed and  are unremarkable.  Exam: . Filed Vitals:   11/29/11 1034  BP: 136/78  Pulse: 60  Height: 5\' 8"  (1.727 m)  Weight: 190 lb  (86.183 kg)    In general, well appearing man.  Mental status:   Place 5/5; time 4/5; 3 word recall 1/3; WORLD 5/5  Impression/Recommendations: 1.  Alzheimer's dementia - continue Aricept 10mg  daily.  No change for now.  May consider sertraline later for nervousness/anxiety.  We will see the patient back in 3 months.  [name], MD [facility], [place]

## 2011-12-12 ENCOUNTER — Other Ambulatory Visit: Payer: Self-pay | Admitting: Neurology

## 2011-12-28 ENCOUNTER — Telehealth: Payer: Self-pay | Admitting: Neurology

## 2011-12-28 NOTE — Telephone Encounter (Signed)
Pt's wife called to report that the patient has been more and more confused. She is concerned that the "pill" he is on is causing the symptoms, but isn't sure. Scheduled pt for fu appt on 01/09/2012 but she wanted to talk to someone before then to see if he needed to come in sooner or at all. She states she will be in and out of the house today at appointments, so leave a message if she doesn't answer the phone.

## 2011-12-28 NOTE — Telephone Encounter (Signed)
Called and spoke with Mrs. Bohlken. She states "he seems to be more confused and more nervous too." We talked about general disease progression. I let her know that Dr. Modesto Charon did mention a medication that he may start him on for his anxiety in his last office note. She states they will wait and discuss that when he comes in on 7/23. She is ok to wait for the scheduled appointment. I told her to call if she needed anything before the appointment.

## 2012-01-01 ENCOUNTER — Telehealth: Payer: Self-pay | Admitting: Neurology

## 2012-01-01 ENCOUNTER — Other Ambulatory Visit: Payer: Self-pay | Admitting: Neurology

## 2012-01-01 MED ORDER — CLONAZEPAM 0.5 MG PO TABS: ORAL_TABLET | ORAL | Status: AC

## 2012-01-01 MED ORDER — SERTRALINE HCL 50 MG PO TABS: 50.0000 mg | ORAL_TABLET | Freq: Every day | ORAL | Status: DC

## 2012-01-01 NOTE — Telephone Encounter (Signed)
Pt's wife LM requesting that we prescribe her husband a "nerve pill." Pt has fu appt scheduled on 01/09/2012 at 11 am, but wife doesn't think they can wait that long. Please call back and advise.

## 2012-01-01 NOTE — Telephone Encounter (Signed)
Spoke with [name]. Aware of the new prescriptions. Will f/u with Dr. Modesto Charon next week.

## 2012-01-01 NOTE — Telephone Encounter (Signed)
zoloft 50mg  daily, 30 pills 3 refills.  also you can give him 0.5mg  clonazepam take 1/2-1 pill bid prn anxiety,disp 30 pills, warn them that the clonazepam  is just to manage his anxiety in the short term until the zoloft has a chance to work.

## 2012-01-01 NOTE — Telephone Encounter (Signed)
**  Dr. Modesto Charon, I spoke with Mrs. Lutes last Thursday about her husband's increased anxiety and nervousness. She said she would just wait and discuss a medication with you at his f/u appointment which is next week. She called back to say they didn't want to wait that long. You mentioned Zoloft in your last office note. Please advise. Thanks.

## 2012-01-09 ENCOUNTER — Ambulatory Visit (INDEPENDENT_AMBULATORY_CARE_PROVIDER_SITE_OTHER): Payer: Medicare Other | Admitting: Neurology

## 2012-01-09 ENCOUNTER — Encounter: Payer: Self-pay | Admitting: Neurology

## 2012-01-09 VITALS — BP 110/70 | HR 60 | Wt 185.0 lb

## 2012-01-09 DIAGNOSIS — F068 Other specified mental disorders due to known physiological condition: Secondary | ICD-10-CM

## 2012-01-09 DIAGNOSIS — G309 Alzheimer's disease, unspecified: Secondary | ICD-10-CM

## 2012-01-09 DIAGNOSIS — F0280 Dementia in other diseases classified elsewhere without behavioral disturbance: Secondary | ICD-10-CM

## 2012-01-09 NOTE — Progress Notes (Signed)
Dear Dr. Alwyn Ren,  I saw  Manuel Evans back in [facility] for his problem with alzheimer's dementia and anxiety.  When I last saw him he was doing reasonably well on donepezil.  He was having some problems with anxiety at times and I had considered starting him on sertraline.  However, his wife wanted to hold off at that time.  He recently had a trip to [place] for a cousin's funeral and he has had what has been termed as worsening anxiety.  He has "nervous shaking" spells without loss of consciousness, where his hands and legs tremor.  His wife thinks it is due to his anxiety.  He does not remember the spells, and denies anxiety.  I gave them clonazepam prn to use while I started sertraline.  The clonazepam helps but makes him feel "funny".  Otherwise his memory is about the same.  Medical history, social history, and family history were reviewed and have not changed since the last clinic visit.  Current Outpatient Prescriptions on File Prior to Visit  Medication Sig Dispense Refill  . acetaminophen (TYLENOL) 325 MG tablet Take 650 mg by mouth as needed.        Marland Kitchen aspirin 81 MG tablet Take 81 mg by mouth daily.       . Calcium Carbonate-Vitamin D (CALCIUM + D PO) Take 600 mg by mouth daily.        . clonazePAM (KLONOPIN) 0.5 MG tablet Take 1/2 to 1 tablet twice a day prn anxiety.  30 tablet  0  . Coenzyme Q10 (COQ10) 100 MG CAPS Take by mouth daily.        Marland Kitchen donepezil (ARICEPT) 10 MG tablet TAKE 1 TABLET (10 MG TOTAL) BY MOUTH DAILY.  30 tablet  3  . Ginkgo Biloba 40 MG TABS Take 120 mg by mouth daily.      . Magnesium Chloride (SLOW-MAG PO) Take by mouth daily.        . Omega-3 Fatty Acids (FISH OIL) 1000 MG CAPS Take by mouth daily.        . ranitidine (ZANTAC) 150 MG capsule Take 150 mg by mouth daily.        . saw palmetto 160 MG capsule Take 160 mg by mouth daily.        . sertraline (ZOLOFT) 50 MG tablet Take 1 tablet (50 mg total) by mouth daily.  30 tablet   3  . vitamin B-12 (CYANOCOBALAMIN) 500 MCG tablet Take 500 mcg by mouth daily.      . vitamin E 400 UNIT capsule Take 200 Units by mouth daily.         No Known Allergies  ROS:  13 systems were reviewed and are unremarkable.  Exam: . Filed Vitals:   01/09/12 1048  BP: 110/70  Pulse: 60  Weight: 185 lb (83.915 kg)    In general, well appearing man.  Motor:  mild postural tremor, bilaterally with normal tone.  Impression/Recommendations: 1.  Spells of tremulousness - My Nelva Bush is that these may be anxiety attacks.  We will see if the sertraline helps.  However, one does have to consider whether they could be seizures as well but I think this is less likely.  He can also use 0.25mg -0.5mg  clonazepam for the tremulous spells. 2.  Alzheimer's dementia - continue donepezil.  [name], MD [facility], [place]

## 2012-01-18 ENCOUNTER — Telehealth: Payer: Self-pay

## 2012-01-18 NOTE — Telephone Encounter (Signed)
Pt had terrible shaking spell since yesterday, she gave him clonazepam 0.5 x 2 last night about 30 min apart, but is still shaking today.  He is still taking the sertraline 50mg  qam.  His hands and feet are just trembling, yesterday his whole body was shaking, so he is a little better today.

## 2012-01-19 NOTE — Telephone Encounter (Signed)
wondering if his nervousness and shakiness was there before the aricept?  If not then we should stop the aricept for 2 weeks to see if it gets better.  Otherwise he can try to take clonazepam 0.25 - 0.5 bid regularly while we see if the sertraline improves his anxiety.

## 2012-01-22 NOTE — Telephone Encounter (Signed)
Spoke with pt's wife, she said he seems to be doing better now, not as shaky.  She thinks his nerves were bad because of a funeral he had gone to.  It does not seem any different now than before the Aricept.  She will give him the clonazepam more regularly and see if this helps while waiting on the sertraline.

## 2012-02-12 ENCOUNTER — Ambulatory Visit (INDEPENDENT_AMBULATORY_CARE_PROVIDER_SITE_OTHER): Payer: Medicare Other | Admitting: Internal Medicine

## 2012-02-12 ENCOUNTER — Encounter: Payer: Self-pay | Admitting: Internal Medicine

## 2012-02-12 ENCOUNTER — Telehealth: Payer: Self-pay

## 2012-02-12 VITALS — BP 122/80 | HR 70 | Temp 97.6°F | Wt 186.2 lb

## 2012-02-12 DIAGNOSIS — R259 Unspecified abnormal involuntary movements: Secondary | ICD-10-CM

## 2012-02-12 DIAGNOSIS — R252 Cramp and spasm: Secondary | ICD-10-CM

## 2012-02-12 NOTE — Patient Instructions (Addendum)
Please increase the sertraline 50 mg to 1 & 1/2 pills daily( 75 mg ).

## 2012-02-12 NOTE — Telephone Encounter (Signed)
Call from Ms Nnamdi, Dacus has been shaking really bad all weekend and she does not know what to do.  None of his nerve medications are helping.  She is going to call his pcp to see if he can do anything until his follow up on Sept 5 at 4:00.

## 2012-02-12 NOTE — Progress Notes (Signed)
  Subjective:    Patient ID: Manuel Evans, male    DOB: 05-Mar-1935, 77 y.o.   MRN: [id number]  HPI  In the last 2 weeks he's had increased shaking; he describes a shaking of the right arm. His wife states that it becomes generalized shaking to include repeated flexion of the head. His neurologist had prescribed sertraline recently and asked him to minimize the clonazepam, concerned that it would worsen his dementia.  His neurologist was not available to address these acute issues he was referred here for evaluation.    Review of Systems He denies loss of consciousness, urine incontinence, stool incontinence, or other seizure stigmata.     Objective:   Physical Exam He is in no acute distress; he follows simple commands . His wife is major historian  Extraocular motion is intact without nystagmus  There is dental asymmetry; he wears a  partial .  No carotid bruits are present. Heart rhythm is regular without significant murmur or gallop.  Deep tendon reflexes are equal and normal. Strength and tone in the extremities also normal.          Assessment & Plan:  #1 muscular limb jerking; historically seizure is not suggested  Plan: Chemistries/electrolytes will be checked along with CK. The sertraline will be increased to one & half pills daily.

## 2012-02-13 LAB — CALCIUM: Calcium: 9.5 mg/dL (ref 8.4–10.5)

## 2012-02-13 LAB — CK: Total CK: 52 U/L (ref 7–232)

## 2012-02-13 LAB — POTASSIUM: Potassium: 4.2 mEq/L (ref 3.5–5.1)

## 2012-02-20 ENCOUNTER — Telehealth: Payer: Self-pay

## 2012-02-20 NOTE — Telephone Encounter (Signed)
Message taken from my voicemail: Patient still with the shakes (really bad), patient will see Dr.Wong this week. Patient's wife would like for Dr.Hopper and Dr.Wong to correspond with each other to figure out why her husband has the shakes that is getting worse.  Dr.Hopper please advise

## 2012-02-22 ENCOUNTER — Ambulatory Visit (INDEPENDENT_AMBULATORY_CARE_PROVIDER_SITE_OTHER): Payer: Medicare Other | Admitting: Neurology

## 2012-02-22 ENCOUNTER — Encounter: Payer: Self-pay | Admitting: Neurology

## 2012-02-22 VITALS — BP 126/70 | HR 70 | Wt 186.0 lb

## 2012-02-22 DIAGNOSIS — F068 Other specified mental disorders due to known physiological condition: Secondary | ICD-10-CM

## 2012-02-22 DIAGNOSIS — G309 Alzheimer's disease, unspecified: Secondary | ICD-10-CM

## 2012-02-22 DIAGNOSIS — G250 Essential tremor: Secondary | ICD-10-CM

## 2012-02-22 DIAGNOSIS — G252 Other specified forms of tremor: Secondary | ICD-10-CM

## 2012-02-22 DIAGNOSIS — F0280 Dementia in other diseases classified elsewhere without behavioral disturbance: Secondary | ICD-10-CM

## 2012-02-23 ENCOUNTER — Other Ambulatory Visit: Payer: Self-pay | Admitting: Neurology

## 2012-02-23 ENCOUNTER — Telehealth: Payer: Self-pay | Admitting: Neurology

## 2012-02-23 MED ORDER — SERTRALINE HCL 50 MG PO TABS: ORAL_TABLET | ORAL | Status: AC

## 2012-02-23 NOTE — Telephone Encounter (Signed)
Left a message on answering machine that refill completed.

## 2012-02-23 NOTE — Progress Notes (Signed)
Dear Dr. Alwyn Ren,  I saw  Manuel Evans back in [facility] for his problem with possible Alzheimer's dementia and spells of shaking.  He continues to have problems with bilateral hand shaking and full body shaking when he gets agitated around his inability to remember.  At baseline he has a mild hand tremor, that does not seem to bother him.  The worries about his tremor seem to be driven by his wife and not the patient.  Originally I felt that his tremor was likely anxiety driven and started him on sertraline for his agitation and anxiety.  I also gave him small dose of clonazepam 0.25mg  - 0.5mg  bid prn for anxiety.  He does not like taking the clonazepam because it makes him sleepy.  His wife thinks the tremor started after the aricept was started, but he thinks he may have had a tremor for some time.  Notably his father also had a tremor.  Because of continued spells of tremor you saw him back and increased his sertraline to 75mg  daily.  They don't think it has helped his spells of agitation.  Medical history, social history, and family history were reviewed and have not changed since the last clinic visit.  Current Outpatient Prescriptions on File Prior to Visit  Medication Sig Dispense Refill  . acetaminophen (TYLENOL) 325 MG tablet Take 650 mg by mouth as needed.        Marland Kitchen aspirin 81 MG tablet Take 81 mg by mouth daily.       . B Complex Vitamins (B COMPLEX 50 PO) Take by mouth daily.      . Calcium Carbonate-Vitamin D (CALCIUM + D PO) Take 600 mg by mouth daily.        . clonazePAM (KLONOPIN) 0.5 MG tablet Take 1/2 to 1 tablet twice a day prn anxiety.  30 tablet  0  . Coenzyme Q10 (COQ10) 100 MG CAPS Take by mouth daily.        Marland Kitchen donepezil (ARICEPT) 10 MG tablet TAKE 1 TABLET (10 MG TOTAL) BY MOUTH DAILY.  30 tablet  3  . Ginkgo Biloba 40 MG TABS Take 120 mg by mouth daily.      . Magnesium Chloride (SLOW-MAG PO) Take by mouth daily.        . Multiple Vitamins-Minerals  (MULTIVITAMIN WITH MINERALS) tablet Take 1 tablet by mouth daily.      . Omega-3 Fatty Acids (FISH OIL) 1000 MG CAPS Take by mouth daily.        . ranitidine (ZANTAC) 150 MG capsule Take 150 mg by mouth daily.        Marland Kitchen ROYAL JELLY PO Take by mouth.      . saw palmetto 160 MG capsule Take 160 mg by mouth daily.        . sertraline (ZOLOFT) 50 MG tablet Take 1 tablet (50 mg total) by mouth daily.  30 tablet  3  . vitamin B-12 (CYANOCOBALAMIN) 500 MCG tablet Take 500 mcg by mouth daily.      . vitamin C (ASCORBIC ACID) 500 MG tablet Take 500 mg by mouth daily.      . vitamin E 400 UNIT capsule Take 200 Units by mouth daily.         No Known Allergies  ROS:  13 systems were reviewed and are notable for continued forgetfulness.  All other review of systems are unremarkable.  Exam: . Filed Vitals:   02/22/12 1542  BP: 126/70  Pulse: 70  Weight: 186 lb (84.369 kg)    In general, well appearing man.  Motor:  Mild right greater than left mainly postural tremor, high freq low amplitude.  No rigidity.  Not brought on by walking.  Normal arm swing, turns well.  Impression/Recommendations:  1.  Essential tremor exacerbated by spells of agitation related to his memory loss.  While I think it is unlikely I am going to have him stop his Aricept for 2 weeks to see if the tremor gets better, which I do not think will be the case.  He will then follow up with Dr. Arbutus Leas about the tremor. She had suggested small doses of seroquel to see if that may help, but she will decided that when the patient returns.  Manuel Raider Modesto Charon, MD [facility], [place]

## 2012-02-23 NOTE — Telephone Encounter (Signed)
Pt's wife called regarding Zoloft rx. Dose has been increased from 50 mg to 75 mg and she would like a new rx sent into her pharmacy. Please call Mrs. Grosser once this is done. She states it is fine to leave a message on the answering machine.

## 2012-02-26 ENCOUNTER — Telehealth: Payer: Self-pay | Admitting: Neurology

## 2012-02-26 MED ORDER — MEMANTINE HCL 5 MG PO TABS: ORAL_TABLET | ORAL | Status: AC

## 2012-02-26 NOTE — Telephone Encounter (Signed)
If he is still doing ok a week from now he can start Namenda 5mg  daily to increase after 2 weeks to Namenda 5mg  bid.

## 2012-02-26 NOTE — Telephone Encounter (Signed)
Pt's wife Chip Boer called to ask a question. She was not interested in leaving a message.

## 2012-02-26 NOTE — Telephone Encounter (Signed)
Called and spoke with the patient's wife. She states that since he stopped the Aricept, he is "back to his old self. He isn't nervous or shaky or trembling." She asked that I let Dr. Modesto Charon know. I told her that I would. She also wanted me to ask him if there was another pill to replace that one with. I told her that he had an appointment with Dr. Arbutus Leas on 9/24 and that she could address that with them then, but she did not seem to want to wait that long for a "replacement pill". I told her I would let Dr. Modesto Charon know this as well. **Dr. Modesto Charon, please advise. Thanks.

## 2012-02-26 NOTE — Telephone Encounter (Signed)
Spoke with Mrs. Cooks. Information given as per Dr. Modesto Charon below. Will wait one week to start Namenda if he continues to be asymptomatic. Will e-scribe to [facility] on [address].

## 2012-02-29 ENCOUNTER — Ambulatory Visit: Payer: Medicare Other | Admitting: Neurology

## 2012-03-04 ENCOUNTER — Telehealth: Payer: Self-pay | Admitting: Neurology

## 2012-03-04 NOTE — Telephone Encounter (Signed)
Called and spoke with the patient's wife. She states his "shaking" is worse and that the pharmacist advised against him starting the Namenda as it could make the shaking worse. He had stopped the Aricept and Mrs. Rugama had called to say he was 100% better and was not shaking. Today she reports that only lasted one day and he is now back to where he was before. She is aware that Dr. Modesto Charon is no longer at [facility] and was wondering if she could see Dr. Arbutus Leas sooner. They have an appointment on 03/12/12. I will speak with Dr. Modesto Charon and/or Dr. Arbutus Leas about a sooner appointment. She is fine with this plan.

## 2012-03-04 NOTE — Telephone Encounter (Signed)
Pt's wife states that pt isn't getting any better. His pills were switched last week and when she went to pick up the new rx, the pharmacist advised her that the new med would produce the same effect as the old one. She states that Manuel Evans's arm is trembling and he is reporting pain in his muscle. She also wants to know if he needs to be seen sooner than his fu appt is scheduled? Mrs. Manuel Evans declined to leave a voice message. Please advise.

## 2012-03-05 NOTE — Telephone Encounter (Signed)
Called and spoke with Mrs. Hauser. Offered her a sooner appointment with Dr. Arbutus Leas this Thursday and she accepted. Instructed to arrive at 0915 for a 0945 appointment. She states she understands.

## 2012-03-07 ENCOUNTER — Other Ambulatory Visit: Payer: Self-pay

## 2012-03-07 ENCOUNTER — Other Ambulatory Visit (INDEPENDENT_AMBULATORY_CARE_PROVIDER_SITE_OTHER): Payer: Medicare Other

## 2012-03-07 ENCOUNTER — Encounter: Payer: Self-pay | Admitting: Neurology

## 2012-03-07 ENCOUNTER — Ambulatory Visit (INDEPENDENT_AMBULATORY_CARE_PROVIDER_SITE_OTHER): Payer: Medicare Other | Admitting: Neurology

## 2012-03-07 VITALS — BP 124/76 | HR 60 | Resp 16 | Ht 67.75 in | Wt 190.0 lb

## 2012-03-07 DIAGNOSIS — R259 Unspecified abnormal involuntary movements: Secondary | ICD-10-CM

## 2012-03-07 DIAGNOSIS — R413 Other amnesia: Secondary | ICD-10-CM

## 2012-03-07 DIAGNOSIS — F0280 Dementia in other diseases classified elsewhere without behavioral disturbance: Secondary | ICD-10-CM

## 2012-03-07 DIAGNOSIS — F068 Other specified mental disorders due to known physiological condition: Secondary | ICD-10-CM

## 2012-03-07 LAB — VITAMIN B12: Vitamin B-12: 686 pg/mL (ref 211–911)

## 2012-03-07 NOTE — Progress Notes (Addendum)
Subjective:   Manuel Evans was seen in consultation in the movement disorder clinic at the request of Manuel Melnick, MD and Manuel Evans.  Because Manuel Evans has left the practice, the patient will be following up with me.  The evaluation is for tremor and memory loss.  I reviewed Manuel Evans records. The patient is accompanied by his wife who supplements the hx.   The patient was initially seen by Manuel Evans for tremor, including full body shaking while awake, when he was agitated.  It was felt that this was an exacerbation of essential tremor and that he also had Alzheimer's dementia.  Sertraline was initiated in hoping that that would decrease agitation.  This did not significantly help.  Clonazepam made the patient excessively fatigued.  At last visit, the Manuel Evans was discontinued just to make sure that that was not causing tremor.  For one day, the tremor was improved dramatically but it came back but just slightly.  After that, his wife called back stating that the tremor was the same.  He decided to hold on the addition of Manuel Evans until today's visit as the pharmacist told him it was the same drug as Manuel Evans and so they decided to wait until today.  The patient is a 77 y.o. right handed male with a history of tremor.Onset of symptoms was sudden, starting about 1 year ago.  His wife believes that it started after the initiation of Manuel Evans.  Tremor primarily involves the right arm and usually only if he gets upset or anxious.  He notes that tremor is only with activation.  Since the d/c of Manuel Evans, he states that the tremor is better and not so bothersome, although it sometimes makes the R arm sore.  His balance has been good. He has had no speech changes. Tremor exacerbated by stress. Tremor is alleviated by nothing except going into a room by himself and calming down.   There is a ? family hx of tremor in a 49 y/o brother.  The patient also has a hx of memory loss.  His short term memory is poor but  long term bad.  He drives without trouble.  He does some limited cooking and has no trouble.  His wife takes care of the monthly finances and always has.  Current/Previously tried tremor medications: Klonopin (sleepy), Zoloft (no help)  Current medications that may exacerbate tremor: none   No Known Allergies  Current Outpatient Prescriptions on File Prior to Visit  Medication Sig Dispense Refill  . acetaminophen (TYLENOL) 325 MG tablet Take 650 mg by mouth as needed.        Marland Kitchen aspirin 81 MG tablet Take 81 mg by mouth daily.       . B Complex Vitamins (B COMPLEX 50 PO) Take by mouth daily.      . Calcium Carbonate-Vitamin D (CALCIUM + D PO) Take 600 mg by mouth daily.        . Coenzyme Q10 (COQ10) 100 MG CAPS Take by mouth daily.        . Ginkgo Biloba 40 MG TABS Take 120 mg by mouth daily.      . Magnesium Chloride (SLOW-MAG PO) Take by mouth daily.        . Multiple Vitamins-Minerals (MULTIVITAMIN WITH MINERALS) tablet Take 1 tablet by mouth daily.      . Omega-3 Fatty Acids (FISH OIL) 1000 MG CAPS Take by mouth daily.        . ranitidine (ZANTAC) 150  MG capsule Take 150 mg by mouth daily.        Marland Kitchen ROYAL JELLY PO Take by mouth.      . saw palmetto 160 MG capsule Take 160 mg by mouth daily.        . sertraline (ZOLOFT) 50 MG tablet Take one and a half tablets (75 mg total) po every day.  60 tablet  3  . vitamin B-12 (CYANOCOBALAMIN) 500 MCG tablet Take 500 mcg by mouth daily.      . vitamin C (ASCORBIC ACID) 500 MG tablet Take 500 mg by mouth daily.      . vitamin E 400 UNIT capsule Take 200 Units by mouth daily.       . clonazePAM (KLONOPIN) 0.5 MG tablet Take 1/2 to 1 tablet twice a day prn anxiety.  30 tablet  0  . memantine (Manuel Evans) 5 MG tablet Take one tablet (5 mg) po daily for 2 weeks then take one tablet (5 mg) po twice a day thereafter.  60 tablet  3    Past Medical History  Diagnosis Date  . Personal history of colonic polyps 2007  . Diverticulosis   . Benign prostatic  hypertrophy   . Cancer     COLON PMH OF;  SKIN CANCER  . Hyperlipidemia     Past Surgical History  Procedure Date  . Colon surgery 2000    Dr [place]  . Colonoscopy w/ polypectomy 2007    [facility]   . Partial gastrectomy 1978  . Nose surgery     for obstruction post trauma  . Colonoscopy 2004    Diverticulosis  . Prostate biopsy 2002    Neoplasm, ManuelKimbrough (no surgery, no radiation)    History   Social History  . Marital Status: Married    Spouse Name: N/A    Number of Children: N/A  . Years of Education: N/A   Occupational History  . retired     Museum/gallery conservator work on cars   Social History Main Topics  . Smoking status: Former Smoker    Quit date: 06/19/1972  . Smokeless tobacco: Never Used  . Alcohol Use: No     none  . Drug Use: No  . Sexually Active: Not on file   Other Topics Concern  . Not on file   Social History Narrative   Regular exercise    Family Status  Relation Status Death Age  . Brother Deceased 13    with MI   . Mother Deceased     CA, unknown type  . Father Deceased     unknown cause of death  . Brother Alive     healthy  . Sister Alive     healthy    Review of Systems A complete 10 system ROS was obtained and was negative apart from what is mentioned.   Objective:   VITALS:   Filed Vitals:   03/07/12 0849  BP: 124/76  Pulse: 60  Resp: 16  Height: 5' 7.75" (1.721 m)  Weight: 190 lb (86.183 kg)   Gen:  Appears stated age and in NAD. HEENT:  Normocephalic, atraumatic. The mucous membranes are dry. The superficial temporal arteries are without ropiness or tenderness. Cardiovascular: Regular rate and rhythm. Lungs: Clear to auscultation bilaterally. Neck: There are no carotid bruits noted bilaterally.  NEUROLOGICAL:  Orientation:  A MoCA was performed and the patient scored a 16/30.  However, he was able to remember some remote details about some of his other  physicians (one has twins) Cranial nerves: There is good  facial symmetry. The pupils are equal round and reactive to light bilaterally. Fundoscopic exam reveals clear disc margins bilaterally. Extraocular muscles are intact and visual fields are full to confrontational testing. Speech is fluent and clear. Soft palate rises symmetrically and there is no tongue deviation. Hearing is intact to conversational tone. Tone: Tone is good throughout. Sensation: Sensation is intact to light touch and pinprick throughout (facial, trunk, extremities). Vibration is intact at the bilateral big toe. There is no extinction with double simultaneous stimulation. There is no sensory dermatomal level identified. Coordination:  The patient has no dysdiadichokinesia or dysmetria. Motor: Strength is 5/5 in the bilateral upper and lower extremities.  Shoulder shrug is equal bilaterally.  There is no pronator drift.  There are no fasciculations noted. DTR's: Deep tendon reflexes are 2-2+/4 at the bilateral biceps, triceps, brachioradialis, patella and 1/4 at the bilateral achilles.  Plantar responses are downgoing bilaterally. Gait and Station: The patient is able to ambulate without difficulty.  He has normal stride length. He has a negative pull test.  MOVEMENT EXAM: Tremor:  There is minimal tremor in the UE, noted most significantly with action.  The patient is able to draw Archimedes spirals without significant difficulty.  There is no tremor at rest.    LABS:  No results found for this basename: RPR   Lab Results  Component Value Date   VITAMINB12 725 05/05/2011   Lab Results  Component Value Date   TSH 1.34 04/17/2011   Lab Results  Component Value Date   FOLATE >20.0 ng/mL 08/11/2009        Assessment:   1.  Tremor exacerbated by medication.  At today's visit, there was virtually no tremor and it sounds like the tremor was directly related to Manuel Evans.  The very minimal tremor present today does not require medication. 2.  Dementia.  I had a long discussion  with the patient and his wife.  We talked about safety.  We talked about the progressive nature of this.  Greater than 50% of this visit was spent in counseling in this regard.   Plan:   1.  He will remain on his sertraline, but will remain off all tremor medications. 2.  We decided to go ahead and start Manuel Evans.  I explained to them that this was in a different class of medications than Manuel Evans.  I gave them samples.  He will start Manuel Evans XR 7 mg daily for one week and then slowly titrate up over the course of a month to Manuel Evans XR 28 mg daily.  I did give him a coupon and asked them to call me in one month to let me know how he is doing, sooner if there are any issues.  3.  I am going to do an occupational therapy driving evaluation. 4.  It will follow up with me in 4 months, sooner should new neurologic issues arise. 5.  He will have an RPR and B12 done. 6.  Start time of visit: 9:40 AM, end time of visit: 10:45 AM.  Addendum:  RPR was negative.

## 2012-03-07 NOTE — Addendum Note (Signed)
Addended by: Lelon Huh on: 03/07/2012 03:43 PM   Modules accepted: Orders

## 2012-03-07 NOTE — Patient Instructions (Addendum)
Please call me in a month and if tolerating the Namenda well, we will call in the Namenda XR 28 mg daily. Call me with questions/concerns  Samples of Namenda XR were given to the patient, quantity 28, Lot Number [id number].

## 2012-03-12 ENCOUNTER — Ambulatory Visit: Payer: Medicare Other | Admitting: Neurology

## 2012-03-18 ENCOUNTER — Telehealth: Payer: Self-pay | Admitting: Neurology

## 2012-03-18 ENCOUNTER — Encounter: Payer: Self-pay | Admitting: Neurology

## 2012-03-18 NOTE — Telephone Encounter (Signed)
Picked up a call from Mrs. Bublitz. She called to report to Dr. Arbutus Leas that the new medication she put her husband on (Namenda) was making his tremor worse; having tremor in his right arm and right foot. She asked to speak directly to Dr. Arbutus Leas but I let her know that she was out of the office until [facility] Monday. I told her that if she thought that the Namenda was making his tremor worse then just stop it and watch for improvement. I asked that she call me next Monday with an update and then we could see if a f/u appointment would be needed or if this could be handled via the telephone. She was ok to wait. I told her if he got worse to give [place] a call sooner. She states she will.

## 2012-03-25 ENCOUNTER — Telehealth: Payer: Self-pay

## 2012-03-25 NOTE — Telephone Encounter (Signed)
Just found out that the patient's wife is in ICU at [facility]. Will not be receiving an update call from her.

## 2012-03-25 NOTE — Telephone Encounter (Signed)
Pt wife is in ICU and she is usually the one administering pt meds. Now his niece and husband is taking care of him need to know how to administer meds and which ones. Pt is in first stages of dementia.PLz advise.      Best numbers to contact: [phone] office [phone] Cell    MW

## 2012-03-25 NOTE — Telephone Encounter (Signed)
Left message on cell informing patient's niece that she can refer to copy of last OV given to patient that has all his medications listed (dose and instruction), if she is unable to locate copy given to patient we can print off a copy of med list and place it at the front for pick-up or fax (if they have access to a fax machine). I requested that they call back to further discuss.

## 2012-03-25 NOTE — Telephone Encounter (Signed)
Please review meds list and have them pick up a copy. They should put his medications in a weekly dispenser so they can monitor whether he is taking them correctly.

## 2012-03-25 NOTE — Telephone Encounter (Signed)
Called home phone, no answer (no voicemail). I spoke with patient's Nephew, he states that he just got off the phone with someone here and they faxed him a copy of medication list.   He stated patient will be here on 04/17/2012 and at that time POA paperwork will be presented

## 2012-03-25 NOTE — Telephone Encounter (Signed)
Call from pt's nephew-in-law, Macon Large.  Pt's wife is in ICU and not doing well.  Pt's niece and husband Mr. Henreitta Leber are trying to care for him.  They are getting signed up to be POA for him.  Mr. Crum is currently at home alone and they are checking on him at least 3 times a day and wanted to know if this was acceptable.  Per Dr. Arbutus Leas, he should not be home alone due to dangers associated with dementia.  Mr. Henreitta Leber is aware.

## 2012-03-26 ENCOUNTER — Telehealth: Payer: Self-pay

## 2012-03-26 NOTE — Telephone Encounter (Signed)
Tim advised of Dr. Don Perking recommendations.  They are only calling the pt now as they are leery of going there with him having firearms in the house.  He does not think he can get the pt to go to hospital voluntarily.  They will call 911 if they experience any more threatening or violent behavior.

## 2012-03-26 NOTE — Telephone Encounter (Signed)
Another phone call from Manuel Evans.  Last night pt became violent and threatening. His tremor was causing his whole body to shake.  He is accusing the hospital of withholding info and and said he was going to shoot all the nurses.  He does have access to weapons.  Manuel Evans took one last night.  Today Manuel Evans went to his nieces job and asked what happened to his gun, did Manuel Evans take it to which she responded no.  He became very agitated again and said if it didn't get put back then he was going to use it on Manuel Evans.  At this point they are not even sure if the want to be come POA.  He does not know what to do at this point.  Manuel Evans has no other family able to care for him.  Would like suggestions as to what they can do.  Can he be committed?  Call the sheriff?

## 2012-03-26 NOTE — Telephone Encounter (Signed)
If violent, threating and confused and potential harm to self/others, they can take him to hosp for eval and they can look into ECF placement. This usually requires 3 day hosp stay and they need to understand that this is a long term solution (cannot just "use" the hospital as hotel while his wife is in hosp).

## 2012-03-28 ENCOUNTER — Ambulatory Visit: Payer: Medicare Other

## 2012-04-17 ENCOUNTER — Encounter: Payer: Medicare Other | Admitting: Internal Medicine

## 2012-06-06 IMAGING — CR DG ORBITS FOR FOREIGN BODY
2 series · 2 of 2 positions shown · non-contrast
Comparison: None.

CLINICAL DATA: 76-year-old male with history of metal exposure to
the orbits and planned MRI.

ORBITS FOR FOREIGN BODY - 2 VIEW

[view not recorded (1 of 2)]
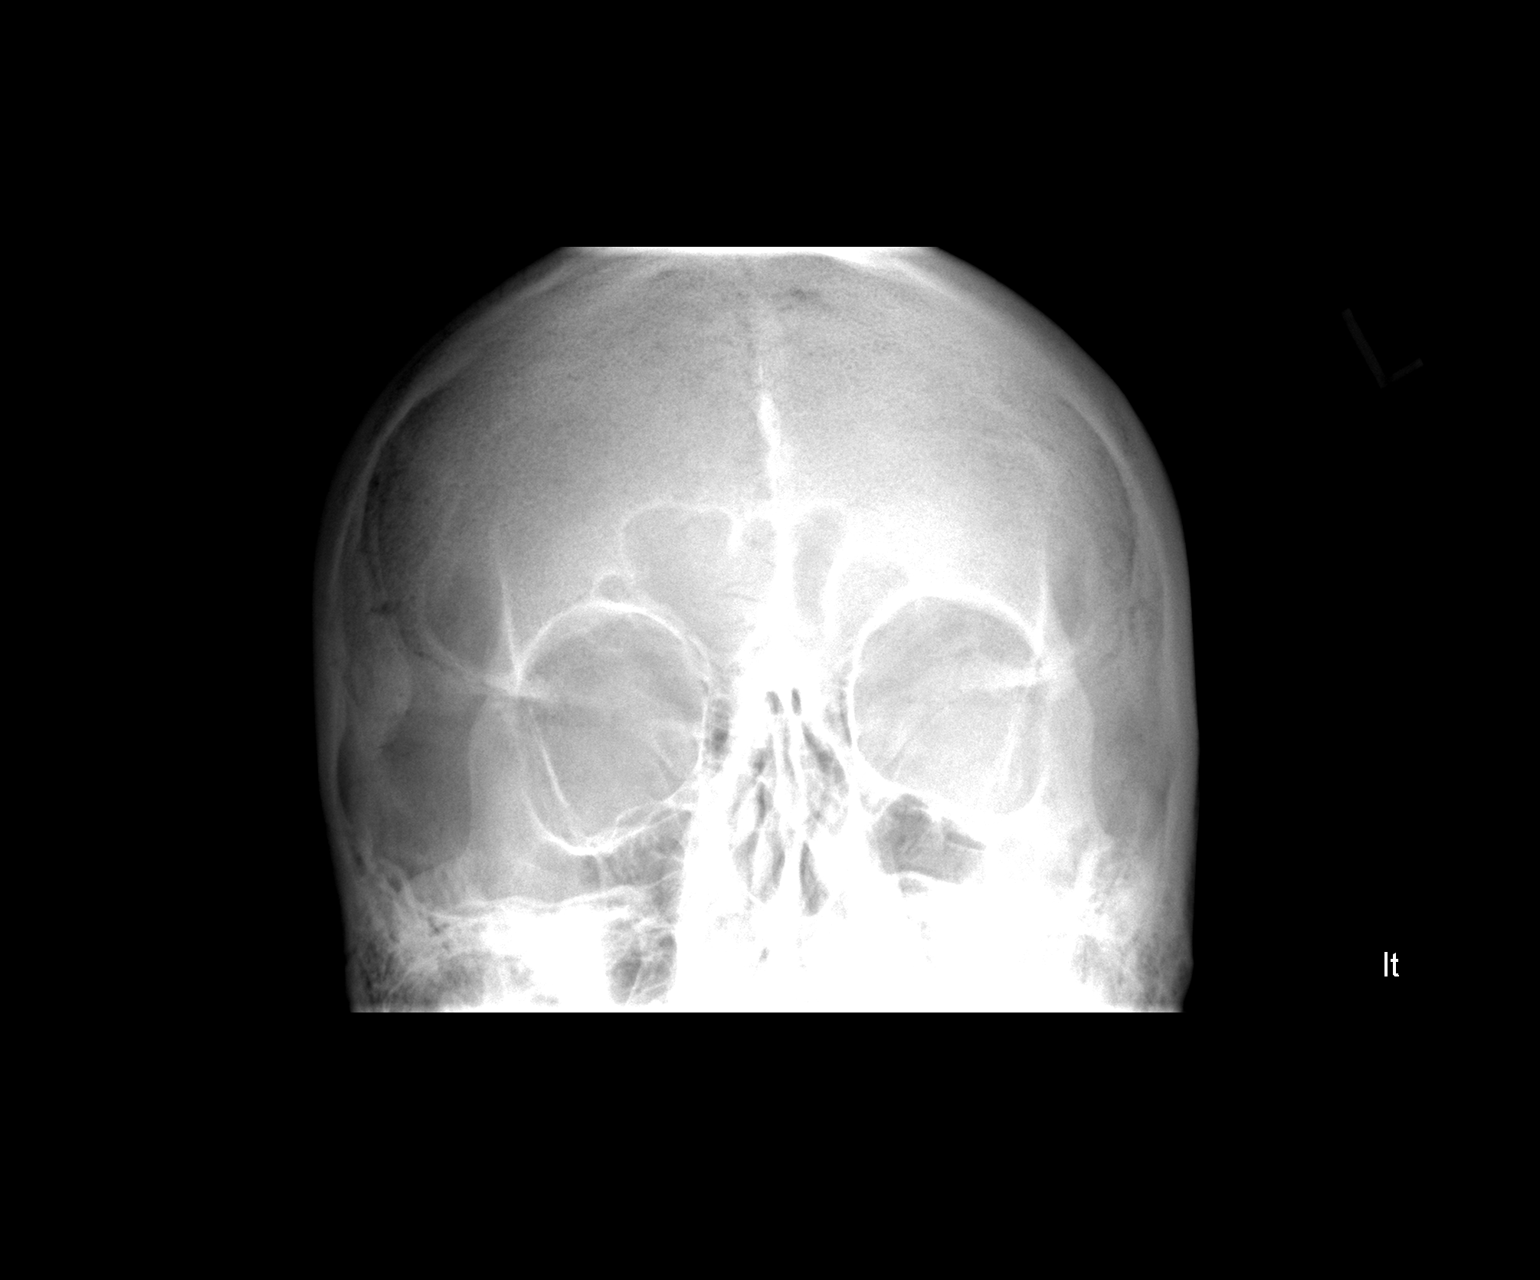

[view not recorded (2 of 2)]
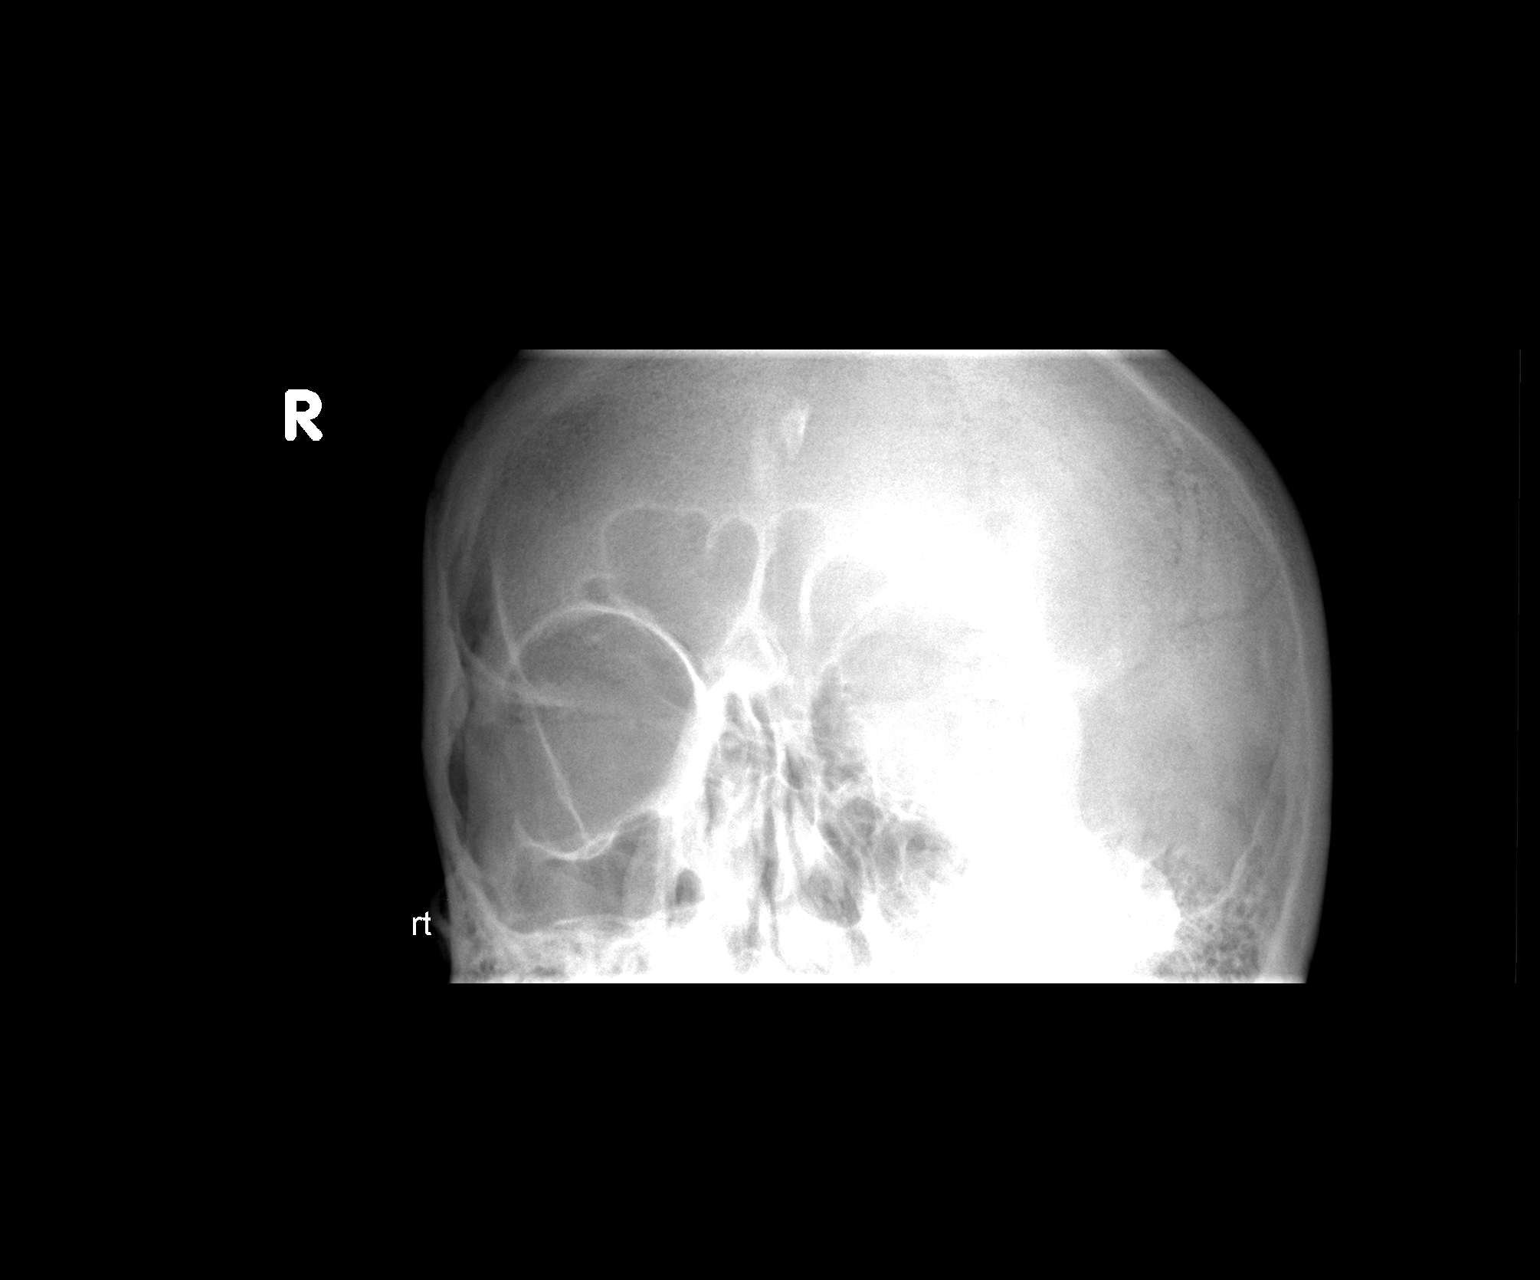

[2 of 2 positions shown; findings below may reference images not displayed]

FINDINGS: There is no evidence of metallic foreign body within the
orbits.  No significant bone abnormality identified. .
IMPRESSION: No evidence of metallic foreign body within the orbits.

## 2012-12-11 ENCOUNTER — Encounter: Payer: Self-pay | Admitting: Gastroenterology

## 2013-02-12 ENCOUNTER — Other Ambulatory Visit: Payer: Self-pay | Admitting: Internal Medicine

## 2013-02-12 NOTE — Telephone Encounter (Signed)
Called pt to inquire as to why he needs a zpack.

## 2013-02-14 NOTE — Telephone Encounter (Signed)
Med denied. Pt does not know what meds he takes. Advised to have his nurse call on Monday. Pt states he is not feeling sick or having any dental procedures.

## 2013-10-06 ENCOUNTER — Telehealth: Payer: Self-pay | Admitting: Neurology

## 2013-10-06 ENCOUNTER — Ambulatory Visit: Payer: Medicare Other | Admitting: Neurology

## 2013-10-06 NOTE — Telephone Encounter (Signed)
Pt no showed today's follow up appt. Caregiver called to r/s. Tish Frederickson spoke w/ caregiver. Advised pt should follow up with his PCP at this point since he has not been here in 65yr+. Caregiver agreed / Gayleen Orem.

## 2013-10-07 ENCOUNTER — Encounter: Payer: Self-pay | Admitting: Gastroenterology

## 2014-03-02 ENCOUNTER — Ambulatory Visit: Payer: Medicare Other | Admitting: Neurology

## 2014-03-02 ENCOUNTER — Telehealth: Payer: Self-pay | Admitting: Neurology

## 2014-03-02 NOTE — Telephone Encounter (Signed)
Pt called to r/s his f/u appt for today 03/02/14. He has no transportation and r/s to 03/17/14.

## 2014-03-03 ENCOUNTER — Encounter: Payer: Self-pay | Admitting: Neurology

## 2014-03-03 NOTE — Telephone Encounter (Signed)
Per Dr. Carles Collet, pt must be discharged from our practice due to repeated no shows. Account marked accordingly and certified letter to be sent to pt / Sherri S.

## 2014-03-05 ENCOUNTER — Encounter (HOSPITAL_COMMUNITY): Payer: Self-pay | Admitting: Emergency Medicine

## 2014-03-05 ENCOUNTER — Ambulatory Visit: Payer: Medicare Other | Admitting: Internal Medicine

## 2014-03-05 ENCOUNTER — Emergency Department (HOSPITAL_COMMUNITY)
Admission: EM | Admit: 2014-03-05 | Discharge: 2014-03-06 | Disposition: A | Discharge status: Home / Self Care | Payer: Medicare Other | Attending: Emergency Medicine | Admitting: Emergency Medicine

## 2014-03-05 DIAGNOSIS — Z860100 Personal history of colon polyps, unspecified: Secondary | ICD-10-CM | POA: Insufficient documentation

## 2014-03-05 DIAGNOSIS — Z87448 Personal history of other diseases of urinary system: Secondary | ICD-10-CM | POA: Diagnosis not present

## 2014-03-05 DIAGNOSIS — Z85828 Personal history of other malignant neoplasm of skin: Secondary | ICD-10-CM | POA: Insufficient documentation

## 2014-03-05 DIAGNOSIS — Z79899 Other long term (current) drug therapy: Secondary | ICD-10-CM | POA: Insufficient documentation

## 2014-03-05 DIAGNOSIS — Z862 Personal history of diseases of the blood and blood-forming organs and certain disorders involving the immune mechanism: Secondary | ICD-10-CM | POA: Insufficient documentation

## 2014-03-05 DIAGNOSIS — R4689 Other symptoms and signs involving appearance and behavior: Secondary | ICD-10-CM

## 2014-03-05 DIAGNOSIS — F911 Conduct disorder, childhood-onset type: Secondary | ICD-10-CM | POA: Diagnosis not present

## 2014-03-05 DIAGNOSIS — K219 Gastro-esophageal reflux disease without esophagitis: Secondary | ICD-10-CM | POA: Diagnosis not present

## 2014-03-05 DIAGNOSIS — Z87891 Personal history of nicotine dependence: Secondary | ICD-10-CM | POA: Diagnosis not present

## 2014-03-05 DIAGNOSIS — F03918 Unspecified dementia, unspecified severity, with other behavioral disturbance: Secondary | ICD-10-CM | POA: Diagnosis not present

## 2014-03-05 DIAGNOSIS — Z008 Encounter for other general examination: Secondary | ICD-10-CM | POA: Insufficient documentation

## 2014-03-05 DIAGNOSIS — Z85038 Personal history of other malignant neoplasm of large intestine: Secondary | ICD-10-CM | POA: Diagnosis not present

## 2014-03-05 DIAGNOSIS — F0391 Unspecified dementia with behavioral disturbance: Secondary | ICD-10-CM | POA: Diagnosis not present

## 2014-03-05 DIAGNOSIS — Z8639 Personal history of other endocrine, nutritional and metabolic disease: Secondary | ICD-10-CM | POA: Insufficient documentation

## 2014-03-05 HISTORY — DX: Unspecified dementia, unspecified severity, without behavioral disturbance, psychotic disturbance, mood disturbance, and anxiety: F03.90

## 2014-03-05 LAB — COMPREHENSIVE METABOLIC PANEL
ALT: 12 U/L (ref 0–53)
AST: 17 U/L (ref 0–37)
Albumin: 4.1 g/dL (ref 3.5–5.2)
Alkaline Phosphatase: 54 U/L (ref 39–117)
Anion gap: 11 (ref 5–15)
BILIRUBIN TOTAL: 0.3 mg/dL (ref 0.3–1.2)
BUN: 11 mg/dL (ref 6–23)
CHLORIDE: 105 meq/L (ref 96–112)
CO2: 25 meq/L (ref 19–32)
Calcium: 9.5 mg/dL (ref 8.4–10.5)
Creatinine, Ser: 0.9 mg/dL (ref 0.50–1.35)
GFR calc Af Amer: >90 mL/min (ref >90)
GFR, EST NON AFRICAN AMERICAN: 80 mL/min — AB (ref >90)
Glucose, Bld: 106 mg/dL — ABNORMAL HIGH (ref 70–99)
Potassium: 4.1 mEq/L (ref 3.7–5.3)
SODIUM: 141 meq/L (ref 137–147)
Total Protein: 7.2 g/dL (ref 6.0–8.3)

## 2014-03-05 LAB — URINALYSIS, ROUTINE W REFLEX MICROSCOPIC
Bilirubin Urine: NEGATIVE
Glucose, UA: NEGATIVE mg/dL
Ketones, ur: NEGATIVE mg/dL
Nitrite: NEGATIVE
PROTEIN: NEGATIVE mg/dL
Specific Gravity, Urine: 1.014 (ref 1.005–1.030)
UROBILINOGEN UA: 0.2 mg/dL (ref 0.0–1.0)
pH: 6 (ref 5.0–8.0)

## 2014-03-05 LAB — CBC
HCT: 40.6 % (ref 39.0–52.0)
Hemoglobin: 13.8 g/dL (ref 13.0–17.0)
MCH: 28.8 pg (ref 26.0–34.0)
MCHC: 34 g/dL (ref 30.0–36.0)
MCV: 84.6 fL (ref 78.0–100.0)
PLATELETS: 222 10*3/uL (ref 150–400)
RBC: 4.8 MIL/uL (ref 4.22–5.81)
RDW: 13.3 % (ref 11.5–15.5)
WBC: 6.2 10*3/uL (ref 4.0–10.5)

## 2014-03-05 LAB — ETHANOL: Alcohol, Ethyl (B): <11 mg/dL (ref 0–11)

## 2014-03-05 LAB — RAPID URINE DRUG SCREEN, HOSP PERFORMED
Amphetamines: NOT DETECTED
Barbiturates: NOT DETECTED
Benzodiazepines: NOT DETECTED
Cocaine: NOT DETECTED
OPIATES: NOT DETECTED
TETRAHYDROCANNABINOL: NOT DETECTED

## 2014-03-05 LAB — URINE MICROSCOPIC-ADD ON

## 2014-03-05 NOTE — ED Notes (Signed)
Pt sent here from [facility] for medical clearance  Pt may return to [facility] when cleared  Paperwork states that pt having progressive decline in ability to care for self  Diagnosed with dementia  He has been wandering away from home, reckless with money and withdrawing large sums from the back  He is threatening to shoot people and has weapons

## 2014-03-06 ENCOUNTER — Emergency Department (HOSPITAL_COMMUNITY): Payer: Medicare Other

## 2014-03-06 NOTE — Discharge Instructions (Signed)
We saw you in the ER for your mental health concerns - [place]. See the [employer] doctors and follow their recommendations. Dementia Dementia is a general term for problems with brain function. A person with dementia has memory loss and a hard time with at least one other brain function such as thinking, speaking, or problem solving. Dementia can affect social functioning, how you do your job, your mood, or your personality. The changes may be hidden for a long time. The earliest forms of this disease are usually not detected by family or friends. Dementia can be:  Irreversible.  Potentially reversible.  Partially reversible.  Progressive. This means it can get worse over time. CAUSES  Irreversible dementia causes may include:  Degeneration of brain cells (Alzheimer disease or Lewy body dementia).  Multiple small strokes (vascular dementia).  Infection (chronic meningitis or Creutzfeldt-Jakob disease).  Frontotemporal dementia. This affects younger people, age 23 to 10, compared to those who have Alzheimer disease.  Dementia associated with other disorders like Parkinson disease, Huntington disease, or HIV-associated dementia. Potentially or partially reversible dementia causes may include:  Medicines.  Metabolic causes such as excessive alcohol intake, vitamin B12 deficiency, or thyroid disease.  Masses or pressure in the brain such as a tumor, blood clot, or hydrocephalus. SIGNS AND SYMPTOMS  Symptoms are often hard to detect. Family members or coworkers may not notice them early in the disease process. Different people with dementia may have different symptoms. Symptoms can include:  A hard time with memory, especially recent memory. Long-term memory may not be impaired.  Asking the same question multiple times or forgetting something someone just said.  A hard time speaking your thoughts or finding certain words.  A hard time solving problems or  performing familiar tasks (such as how to use a telephone).  Sudden changes in mood.  Changes in personality, especially increasing moodiness or mistrust.  Depression.  A hard time understanding complex ideas that were never a problem in the past. DIAGNOSIS  There are no specific tests for dementia.   Your health care provider may recommend a thorough evaluation. This is because some forms of dementia can be reversible. The evaluation will likely include a physical exam and getting a detailed history from you and a family member. The history often gives the best clues and suggestions for a diagnosis.  Memory testing may be done. A detailed brain function evaluation called neuropsychologic testing may be helpful.  Lab tests and brain imaging (such as a CT scan or MRI scan) are sometimes important.  Sometimes observation and re-evaluation over time is very helpful. TREATMENT  Treatment depends on the cause.   If the problem is a vitamin deficiency, it may be helped or cured with supplements.  For dementias such as Alzheimer disease, medicines are available to stabilize or slow the course of the disease. There are no cures for this type of dementia.  Your health care provider can help direct you to groups, organizations, and other health care providers to help with decisions in the care of you or your loved one. HOME CARE INSTRUCTIONS The care of individuals with dementia is varied and dependent upon the progression of the dementia. The following suggestions are intended for the person living with, or caring for, the person with dementia.  Create a safe environment.  Remove the locks on bathroom doors to prevent the person from accidentally locking himself or herself in.  Use childproof latches on kitchen cabinets and any  place where cleaning supplies, chemicals, or alcohol are kept.  Use childproof covers in unused electrical outlets.  Install childproof devices to keep doors and  windows secured.  Remove stove knobs or install safety knobs and an automatic shut-off on the stove.  Lower the temperature on water heaters.  Label medicines and keep them locked up.  Secure knives, lighters, matches, power tools, and guns, and keep these items out of reach.  Keep the house free from clutter. Remove rugs or anything that might contribute to a fall.  Remove objects that might break and hurt the person.  Make sure lighting is good, both inside and outside.  Install grab rails as needed.  Use a monitoring device to alert you to falls or other needs for help.  Reduce confusion.  Keep familiar objects and people around.  Use night lights or dim lights at night.  Label items or areas.  Use reminders, notes, or directions for daily activities or tasks.  Keep a simple, consistent routine for waking, meals, bathing, dressing, and bedtime.  Create a calm, quiet environment.  Place large clocks and calendars prominently.  Display emergency numbers and home address near all telephones.  Use cues to establish different times of the day. An example is to open curtains to let the natural light in during the day.   Use effective communication.  Choose simple words and short sentences.  Use a gentle, calm tone of voice.  Be careful not to interrupt.  If the person is struggling to find a word or communicate a thought, try to provide the word or thought.  Ask one question at a time. Allow the person ample time to answer questions. Repeat the question again if the person does not respond.  Reduce nighttime restlessness.  Provide a comfortable bed.  Have a consistent nighttime routine.  Ensure a regular walking or physical activity schedule. Involve the person in daily activities as much as possible.  Limit napping during the day.  Limit caffeine.  Attend social events that stimulate rather than overwhelm the senses.  Encourage good nutrition and  hydration.  Reduce distractions during meal times and snacks.  Avoid foods that are too hot or too cold.  Monitor chewing and swallowing ability.  Continue with routine vision, hearing, dental, and medical screenings.  Give medicines only as directed by the health care provider.  Monitor driving abilities. Do not allow the person to drive when safe driving is no longer possible.  Register with an identification program which could provide location assistance in the event of a missing person situation. SEEK MEDICAL CARE IF:   New behavioral problems start such as moodiness, aggressiveness, or seeing things that are not there (hallucinations).  Any new problem with brain function happens. This includes problems with balance, speech, or falling a lot.  Problems with swallowing develop.  Any symptoms of other illness happen. Small changes or worsening in any aspect of brain function can be a sign that the illness is getting worse. It can also be a sign of another medical illness such as infection. Seeing a health care provider right away is important. SEEK IMMEDIATE MEDICAL CARE IF:   A fever develops.  New or worsened confusion develops.  New or worsened sleepiness develops.  Staying awake becomes hard to do. Document Released: 11/29/2000 Document Revised: 10/20/2013 Document Reviewed: 10/31/2010 [facility] Patient Information 2015 [place], [place]. This information is not intended to replace advice given to you by your health care provider. Make sure you  discuss any questions you have with your health care provider. ° °

## 2014-03-06 NOTE — ED Notes (Signed)
Results faxed to [facility] 

## 2014-03-06 NOTE — ED Notes (Signed)
Xray report faxed to [facility]

## 2014-03-06 NOTE — ED Notes (Signed)
[facility] called and stated that [facility] requested a cxr for this pt  Notified EDP  Order received

## 2014-03-06 NOTE — ED Provider Notes (Signed)
CSN: [id number]     Arrival date & time 03/05/14  2043 History   First MD Initiated Contact with Patient 03/05/14 2324     Chief Complaint  Patient presents with  . Medical Clearance     (Consider location/radiation/quality/duration/timing/severity/associated sxs/prior Treatment) HPI Comments: Pt w/ hx of dementia, HL, GERD comes in from [facility] for med clearance. Pt is INVOLUNTARILY COMMITTED. Pt was seen at [facility] for aggressive and threatening behavior and steady decline in mentation. Psych evaluated, IVC done, and sent to the ER for medical clearance. They will accept the patient once cleared. Pt denies nausea, emesis, fevers, chills, chest pains, shortness of breath, headaches, abdominal pain, uti like symptoms.   The history is provided by the patient.    Past Medical History  Diagnosis Date  . Personal history of colonic polyps 2007  . Diverticulosis   . Benign prostatic hypertrophy   . Cancer     COLON PMH OF;  SKIN CANCER  . Hyperlipidemia   . Dementia    Past Surgical History  Procedure Laterality Date  . Colon surgery  2000    Dr Lennie Hummer  . Colonoscopy w/ polypectomy  2007    Dr.Patterson   . Partial gastrectomy  1978  . Nose surgery      for obstruction post trauma  . Colonoscopy  2004    Diverticulosis  . Prostate biopsy  2002    Neoplasm, Dr.Kimbrough (no surgery, no radiation)   Family History  Problem Relation Age of Onset  . Cancer Father     ? lung  . Cancer Mother     CNS  . Heart attack Brother   . Ovarian cancer Sister   . Lung cancer Brother    History  Substance Use Topics  . Smoking status: Former Smoker    Quit date: 06/19/1972  . Smokeless tobacco: Never Used  . Alcohol Use: No     Comment: none    Review of Systems  Constitutional: Positive for activity change.  Respiratory: Negative for cough, chest tightness and shortness of breath.   Cardiovascular: Negative for chest pain.  Gastrointestinal: Negative for abdominal pain.   Genitourinary: Negative for dysuria.  Neurological: Negative for headaches.  Psychiatric/Behavioral: Negative for confusion.      Allergies  Review of patient's allergies indicates no known allergies.  Home Medications   Prior to Admission medications   Medication Sig Start Date End Date Taking? Authorizing Provider  clonazePAM (KLONOPIN) 0.5 MG tablet Take 1/2 to 1 tablet twice a day prn anxiety. 01/01/12   Clearnce Sorrel, MD  memantine (NAMENDA) 5 MG tablet Take one tablet (5 mg) po daily for 2 weeks then take one tablet (5 mg) po twice a day thereafter. 02/26/12   Clearnce Sorrel, MD  Omega-3 Fatty Acids (FISH OIL) 1000 MG CAPS Take by mouth daily.      Historical Provider, MD  ranitidine (ZANTAC) 150 MG capsule Take 150 mg by mouth daily.      Historical Provider, MD  sertraline (ZOLOFT) 50 MG tablet Take one and a half tablets (75 mg total) po every day. 02/23/12   Clearnce Sorrel, MD   BP 172/73  Pulse 65  Temp(Src) 98.3 F (36.8 C) (Oral)  Resp 20  SpO2 98% Physical Exam  Nursing note and vitals reviewed. Constitutional: He appears well-developed.  Eyes: Conjunctivae are normal.  Neck: Neck supple.  Cardiovascular: Normal rate and regular rhythm.   Pulmonary/Chest: Effort normal and breath sounds normal.  Abdominal: Soft. He exhibits no distension. There is no tenderness.  Neurological: He is alert.    ED Course  Procedures (including critical care time) Labs Review Labs Reviewed  COMPREHENSIVE METABOLIC PANEL - Abnormal; Notable for the following:    Glucose, Bld 106 (*)    GFR calc non Af Amer 80 (*)    All other components within normal limits  URINALYSIS, ROUTINE W REFLEX MICROSCOPIC - Abnormal; Notable for the following:    APPearance CLOUDY (*)    Hgb urine dipstick TRACE (*)    Leukocytes, UA SMALL (*)    All other components within normal limits  CBC  ETHANOL  URINE RAPID DRUG SCREEN (HOSP PERFORMED)  URINE MICROSCOPIC-ADD ON    Imaging Review No  results found.   EKG Interpretation   Date/Time:  Thursday March 05 2014 22:07:06 EDT Ventricular Rate:  65 PR Interval:  141 QRS Duration: 122 QT Interval:  423 QTC Calculation: 440 R Axis:   -51 Text Interpretation:  Sinus rhythm RBBB and LAFB Minimal ST elevation,  lateral leads new conduction abnormality since 2000 No acute findings  Confirmed by Kathrynn Humble, MD, Denzil Bristol [phone]) on 03/06/2014 12:13:56 AM      MDM   Final diagnoses:  Combative behavior  Dementia, with behavioral disturbance    Pt comes in with cc of medical clearance.  Pt has no complains currently. His labs are normal, exam is normal. Pt is medically clear, and will be transferred to [facility] soon.  Varney Biles, MD 03/06/14 [phone]

## 2014-03-10 ENCOUNTER — Telehealth: Payer: Self-pay | Admitting: Neurology

## 2014-03-10 NOTE — Telephone Encounter (Signed)
Dismissal Letter sent by Certified Mail 34/19/62  Dismissal Letter was returned Unclaimed 04/20/2014  Dismissal Letter sent by [employer] Mail 04/20/2014

## 2014-03-17 ENCOUNTER — Ambulatory Visit: Payer: Medicare Other | Admitting: Neurology

## 2014-03-17 ENCOUNTER — Ambulatory Visit: Payer: Medicare Other | Admitting: Internal Medicine

## 2014-11-20 ENCOUNTER — Encounter: Payer: Self-pay | Admitting: Gastroenterology

## 2015-06-28 IMAGING — CR DG CHEST 2V
2 series · 2 of 2 positions shown · non-contrast
Comparison: 05/21/2008

CLINICAL DATA: Medical clearance.  History of smoking.

EXAM:
CHEST  2 VIEW

[w chest pa]
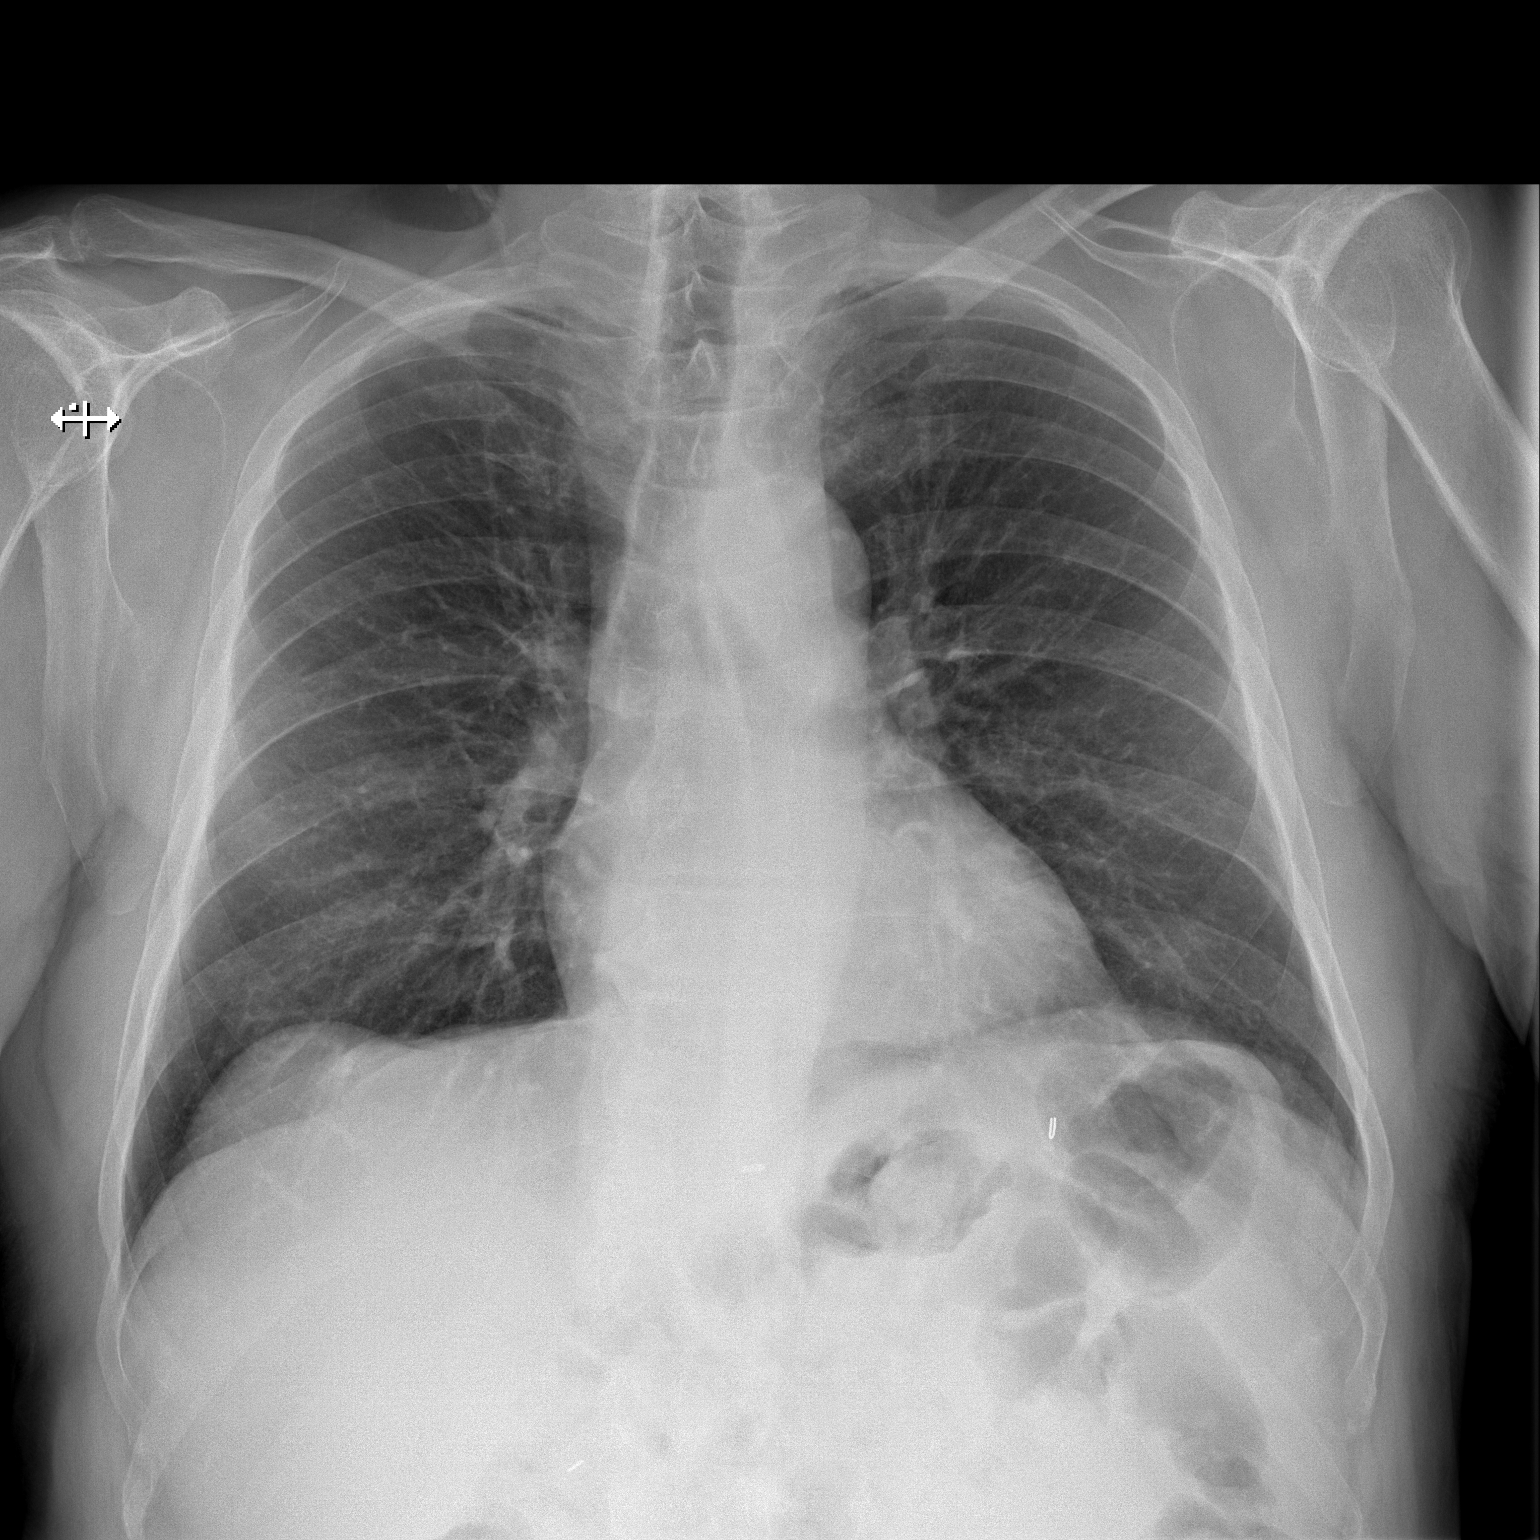

[w chest lat]
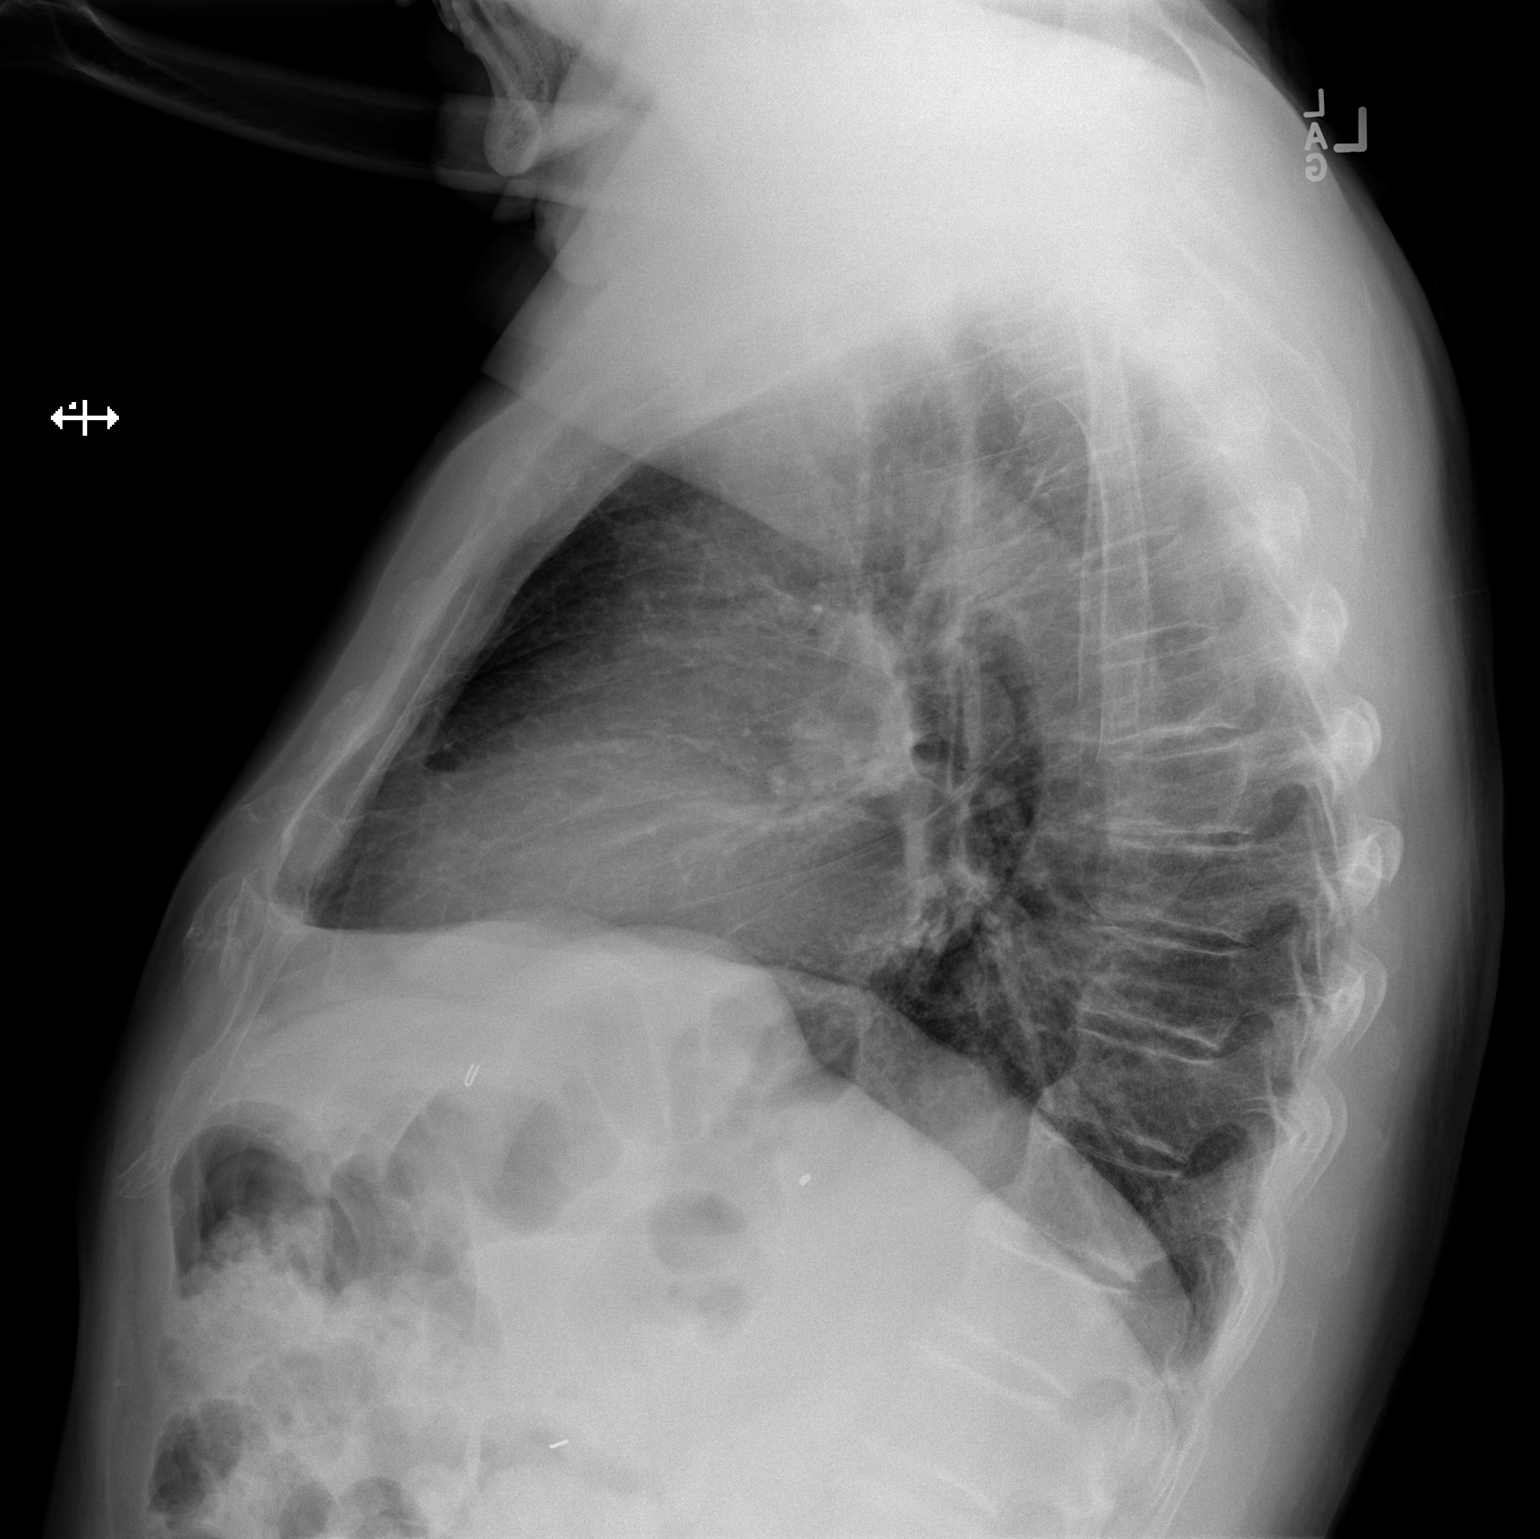

[2 of 2 positions shown; findings below may reference images not displayed]

FINDINGS: The heart size and mediastinal contours are within normal limits.
Both lungs are clear. The visualized skeletal structures are
unremarkable. Surgical clips are identified in the upper abdomen.
IMPRESSION: No active cardiopulmonary disease.

## 2022-10-18 DEATH — deceased
